# Patient Record
Sex: Male | Born: 1965 | Race: Black or African American | Hispanic: No | Marital: Married | Smoking: Former smoker
Health system: Southern US, Community
[De-identification: ages and names within clinical notes are randomized; demographics above are authoritative.]

## PROBLEM LIST (undated history)

## (undated) DIAGNOSIS — T7840XA Allergy, unspecified, initial encounter: Secondary | ICD-10-CM

## (undated) DIAGNOSIS — E7800 Pure hypercholesterolemia, unspecified: Secondary | ICD-10-CM

## (undated) DIAGNOSIS — I10 Essential (primary) hypertension: Secondary | ICD-10-CM

## (undated) DIAGNOSIS — R519 Headache, unspecified: Secondary | ICD-10-CM

## (undated) HISTORY — DX: Allergy, unspecified, initial encounter: T78.40XA

## (undated) HISTORY — DX: Headache, unspecified: R51.9

## (undated) HISTORY — PX: ROTATOR CUFF REPAIR: SHX139

## (undated) HISTORY — DX: Pure hypercholesterolemia, unspecified: E78.00

## (undated) HISTORY — PX: COLONOSCOPY: SHX174

## (undated) HISTORY — PX: COSMETIC SURGERY: SHX468

---

## 2006-01-18 ENCOUNTER — Emergency Department (HOSPITAL_COMMUNITY): Admission: EM | Admit: 2006-01-18 | Discharge: 2006-01-18 | Payer: Self-pay | Admitting: Family Medicine

## 2006-01-20 ENCOUNTER — Emergency Department (HOSPITAL_COMMUNITY): Admission: EM | Admit: 2006-01-20 | Discharge: 2006-01-20 | Payer: Self-pay | Admitting: Family Medicine

## 2006-05-03 ENCOUNTER — Emergency Department (HOSPITAL_COMMUNITY): Admission: EM | Admit: 2006-05-03 | Discharge: 2006-05-03 | Payer: Self-pay | Admitting: *Deleted

## 2006-05-17 ENCOUNTER — Ambulatory Visit (HOSPITAL_COMMUNITY): Admission: RE | Admit: 2006-05-17 | Discharge: 2006-05-17 | Payer: Self-pay | Admitting: Orthopedic Surgery

## 2007-07-10 ENCOUNTER — Encounter: Admission: RE | Admit: 2007-07-10 | Discharge: 2007-07-10 | Payer: Self-pay

## 2009-06-04 ENCOUNTER — Encounter: Admission: RE | Admit: 2009-06-04 | Discharge: 2009-06-04 | Payer: Self-pay | Admitting: Internal Medicine

## 2009-07-14 ENCOUNTER — Inpatient Hospital Stay (HOSPITAL_COMMUNITY): Admission: EM | Admit: 2009-07-14 | Discharge: 2009-07-16 | Payer: Self-pay | Admitting: Emergency Medicine

## 2009-07-22 ENCOUNTER — Emergency Department (HOSPITAL_COMMUNITY)
Admission: EM | Admit: 2009-07-22 | Discharge: 2009-07-22 | Payer: Self-pay | Source: Home / Self Care | Admitting: Emergency Medicine

## 2010-06-09 LAB — LIPID PANEL
Cholesterol: 168 mg/dL (ref 0–200)
HDL: 41 mg/dL (ref >39)
LDL Cholesterol: 101 mg/dL — ABNORMAL HIGH (ref 0–99)
Total CHOL/HDL Ratio: 4.1 RATIO
Triglycerides: 132 mg/dL (ref <150)
VLDL: 26 mg/dL (ref 0–40)

## 2010-06-09 LAB — COMPREHENSIVE METABOLIC PANEL
ALT: 14 U/L (ref 0–53)
ALT: 17 U/L (ref 0–53)
ALT: 19 U/L (ref 0–53)
AST: 13 U/L (ref 0–37)
AST: 19 U/L (ref 0–37)
AST: 24 U/L (ref 0–37)
Albumin: 3 g/dL — ABNORMAL LOW (ref 3.5–5.2)
Albumin: 3.1 g/dL — ABNORMAL LOW (ref 3.5–5.2)
Albumin: 3.6 g/dL (ref 3.5–5.2)
Alkaline Phosphatase: 41 U/L (ref 39–117)
Alkaline Phosphatase: 41 U/L (ref 39–117)
Alkaline Phosphatase: 46 U/L (ref 39–117)
BUN: 15 mg/dL (ref 6–23)
BUN: 7 mg/dL (ref 6–23)
BUN: 9 mg/dL (ref 6–23)
CO2: 26 mEq/L (ref 19–32)
CO2: 26 mEq/L (ref 19–32)
CO2: 26 mEq/L (ref 19–32)
Calcium: 8.8 mg/dL (ref 8.4–10.5)
Calcium: 8.9 mg/dL (ref 8.4–10.5)
Calcium: 9.3 mg/dL (ref 8.4–10.5)
Chloride: 102 mEq/L (ref 96–112)
Chloride: 105 mEq/L (ref 96–112)
Chloride: 105 mEq/L (ref 96–112)
Creatinine, Ser: 1.12 mg/dL (ref 0.4–1.5)
Creatinine, Ser: 1.16 mg/dL (ref 0.4–1.5)
Creatinine, Ser: 1.2 mg/dL (ref 0.4–1.5)
GFR calc Af Amer: >60 mL/min (ref >60)
GFR calc Af Amer: >60 mL/min (ref >60)
GFR calc Af Amer: >60 mL/min (ref >60)
GFR calc non Af Amer: >60 mL/min (ref >60)
GFR calc non Af Amer: >60 mL/min (ref >60)
GFR calc non Af Amer: >60 mL/min (ref >60)
Glucose, Bld: 107 mg/dL — ABNORMAL HIGH (ref 70–99)
Glucose, Bld: 110 mg/dL — ABNORMAL HIGH (ref 70–99)
Glucose, Bld: 87 mg/dL (ref 70–99)
Potassium: 3.7 mEq/L (ref 3.5–5.1)
Potassium: 3.8 mEq/L (ref 3.5–5.1)
Potassium: 4.1 mEq/L (ref 3.5–5.1)
Sodium: 133 mEq/L — ABNORMAL LOW (ref 135–145)
Sodium: 137 mEq/L (ref 135–145)
Sodium: 137 mEq/L (ref 135–145)
Total Bilirubin: 0.9 mg/dL (ref 0.3–1.2)
Total Bilirubin: 1 mg/dL (ref 0.3–1.2)
Total Bilirubin: 1.2 mg/dL (ref 0.3–1.2)
Total Protein: 6.2 g/dL (ref 6.0–8.3)
Total Protein: 6.3 g/dL (ref 6.0–8.3)
Total Protein: 6.8 g/dL (ref 6.0–8.3)

## 2010-06-09 LAB — DIFFERENTIAL
Basophils Absolute: 0 10*3/uL (ref 0.0–0.1)
Basophils Relative: 0 % (ref 0–1)
Eosinophils Absolute: 0.4 10*3/uL (ref 0.0–0.7)
Eosinophils Relative: 5 % (ref 0–5)
Lymphocytes Relative: 27 % (ref 12–46)
Lymphs Abs: 1.9 10*3/uL (ref 0.7–4.0)
Monocytes Absolute: 0.6 10*3/uL (ref 0.1–1.0)
Monocytes Relative: 9 % (ref 3–12)
Neutro Abs: 4.3 10*3/uL (ref 1.7–7.7)
Neutrophils Relative %: 60 % (ref 43–77)

## 2010-06-09 LAB — CBC
HCT: 38.8 % — ABNORMAL LOW (ref 39.0–52.0)
HCT: 41 % (ref 39.0–52.0)
Hemoglobin: 13.4 g/dL (ref 13.0–17.0)
Hemoglobin: 14.3 g/dL (ref 13.0–17.0)
MCHC: 34.5 g/dL (ref 30.0–36.0)
MCHC: 34.9 g/dL (ref 30.0–36.0)
MCV: 87.9 fL (ref 78.0–100.0)
MCV: 88 fL (ref 78.0–100.0)
Platelets: 170 10*3/uL (ref 150–400)
Platelets: 197 10*3/uL (ref 150–400)
RBC: 4.41 MIL/uL (ref 4.22–5.81)
RBC: 4.65 MIL/uL (ref 4.22–5.81)
RDW: 13.3 % (ref 11.5–15.5)
RDW: 13.4 % (ref 11.5–15.5)
WBC: 5.8 10*3/uL (ref 4.0–10.5)
WBC: 7.3 10*3/uL (ref 4.0–10.5)

## 2010-06-09 LAB — LACTATE DEHYDROGENASE: LDH: 115 U/L (ref 94–250)

## 2010-06-09 LAB — LIPASE, BLOOD
Lipase: 147 U/L — ABNORMAL HIGH (ref 11–59)
Lipase: 28 U/L (ref 11–59)
Lipase: 35 U/L (ref 11–59)

## 2011-11-17 ENCOUNTER — Ambulatory Visit (INDEPENDENT_AMBULATORY_CARE_PROVIDER_SITE_OTHER): Payer: BC Managed Care – PPO | Admitting: Family Medicine

## 2011-11-17 VITALS — BP 130/82 | HR 60 | Temp 98.1°F | Resp 16 | Ht 69.25 in | Wt 211.0 lb

## 2011-11-17 DIAGNOSIS — B9789 Other viral agents as the cause of diseases classified elsewhere: Secondary | ICD-10-CM

## 2011-11-17 DIAGNOSIS — I10 Essential (primary) hypertension: Secondary | ICD-10-CM | POA: Insufficient documentation

## 2011-11-17 DIAGNOSIS — T6391XA Toxic effect of contact with unspecified venomous animal, accidental (unintentional), initial encounter: Secondary | ICD-10-CM

## 2011-11-17 DIAGNOSIS — L739 Follicular disorder, unspecified: Secondary | ICD-10-CM

## 2011-11-17 DIAGNOSIS — T63481A Toxic effect of venom of other arthropod, accidental (unintentional), initial encounter: Secondary | ICD-10-CM

## 2011-11-17 DIAGNOSIS — R5381 Other malaise: Secondary | ICD-10-CM

## 2011-11-17 DIAGNOSIS — L678 Other hair color and hair shaft abnormalities: Secondary | ICD-10-CM

## 2011-11-17 DIAGNOSIS — L738 Other specified follicular disorders: Secondary | ICD-10-CM

## 2011-11-17 DIAGNOSIS — B349 Viral infection, unspecified: Secondary | ICD-10-CM

## 2011-11-17 LAB — POCT CBC
Granulocyte percent: 59.3 %G (ref 37–80)
HCT, POC: 46.9 % (ref 43.5–53.7)
Hemoglobin: 14.5 g/dL (ref 14.1–18.1)
Lymph, poc: 1.7 (ref 0.6–3.4)
MCH, POC: 28.7 pg (ref 27–31.2)
MCHC: 30.9 g/dL — AB (ref 31.8–35.4)
MCV: 92.8 fL (ref 80–97)
MID (cbc): 0.4 (ref 0–0.9)
MPV: 9.8 fL (ref 0–99.8)
POC Granulocyte: 3.1 (ref 2–6.9)
POC LYMPH PERCENT: 33.1 %L (ref 10–50)
POC MID %: 7.6 %M (ref 0–12)
Platelet Count, POC: 224 10*3/uL (ref 142–424)
RBC: 5.05 M/uL (ref 4.69–6.13)
RDW, POC: 14.4 %
WBC: 5.2 10*3/uL (ref 4.6–10.2)

## 2011-11-17 MED ORDER — DOXYCYCLINE HYCLATE 100 MG PO TABS: 100.0000 mg | ORAL_TABLET | Freq: Two times a day (BID) | ORAL | Status: AC
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
## 2011-11-17 NOTE — Patient Instructions (Signed)
Take the antibiotic twice daily.

## 2011-11-20 LAB — WOUND CULTURE
Gram Stain: NONE SEEN
Gram Stain: NONE SEEN
Gram Stain: NONE SEEN
Organism ID, Bacteria: NO GROWTH

## 2012-01-11 ENCOUNTER — Ambulatory Visit: Payer: Self-pay | Admitting: *Deleted

## 2012-06-09 ENCOUNTER — Other Ambulatory Visit: Payer: Self-pay | Admitting: Internal Medicine

## 2012-06-09 DIAGNOSIS — M542 Cervicalgia: Secondary | ICD-10-CM

## 2012-06-20 HISTORY — PX: SHOULDER SURGERY: SHX246

## 2014-10-08 ENCOUNTER — Ambulatory Visit: Payer: Self-pay

## 2014-10-08 ENCOUNTER — Other Ambulatory Visit: Payer: Self-pay | Admitting: Occupational Medicine

## 2014-10-08 DIAGNOSIS — M25531 Pain in right wrist: Secondary | ICD-10-CM

## 2014-12-17 ENCOUNTER — Other Ambulatory Visit: Payer: Self-pay | Admitting: Occupational Medicine

## 2014-12-17 ENCOUNTER — Ambulatory Visit: Payer: Self-pay

## 2014-12-17 DIAGNOSIS — M79671 Pain in right foot: Secondary | ICD-10-CM

## 2015-06-03 ENCOUNTER — Other Ambulatory Visit: Payer: Self-pay | Admitting: Orthopedic Surgery

## 2015-06-05 NOTE — Pre-Procedure Instructions (Signed)
    Leane Plattnthony Brackin  06/05/2015      [id number]- [place], [place] - [address] [place] [id number] Phone: [phone] Fax: [phone]    Your procedure is scheduled on 06/10/15.  [facility] Admitting at 12 noon  Call this number if you have problems the morning of surgery:  [phone]   Remember:  Do not eat food or drink liquids after midnight.  Take these medicines the morning of surgery with A SIP OF WATER --norvasc   Do not wear jewelry, make-up or nail polish.  Do not wear lotions, powders, or perfumes.  You may wear deodorant.  Do not shave 48 hours prior to surgery.  Men may shave face and neck.  Do not bring valuables to the hospital.  [facility] is not responsible for any belongings or valuables.  Contacts, dentures or bridgework may not be worn into surgery.  Leave your suitcase in the car.  After surgery it may be brought to your room.  For patients admitted to the hospital, discharge time will be determined by your treatment team.  Patients discharged the day of surgery will not be allowed to drive home.   Name and phone number of your driver:  Special instructions:    Please read over the following fact sheets that you were given. Pain Booklet, Coughing and Deep Breathing and Surgical Site Infection Prevention

## 2015-06-06 ENCOUNTER — Other Ambulatory Visit: Payer: Self-pay

## 2015-06-06 ENCOUNTER — Encounter (HOSPITAL_COMMUNITY)
Admission: RE | Admit: 2015-06-06 | Discharge: 2015-06-06 | Disposition: A | Discharge status: Home / Self Care | Payer: BLUE CROSS/BLUE SHIELD | Source: Ambulatory Visit | Attending: Orthopedic Surgery | Admitting: Orthopedic Surgery

## 2015-06-06 ENCOUNTER — Encounter (HOSPITAL_COMMUNITY): Payer: Self-pay

## 2015-06-06 DIAGNOSIS — Z01818 Encounter for other preprocedural examination: Secondary | ICD-10-CM | POA: Insufficient documentation

## 2015-06-06 DIAGNOSIS — I10 Essential (primary) hypertension: Secondary | ICD-10-CM | POA: Insufficient documentation

## 2015-06-06 DIAGNOSIS — M7541 Impingement syndrome of right shoulder: Secondary | ICD-10-CM | POA: Diagnosis not present

## 2015-06-06 DIAGNOSIS — Z01812 Encounter for preprocedural laboratory examination: Secondary | ICD-10-CM | POA: Insufficient documentation

## 2015-06-06 DIAGNOSIS — Z87891 Personal history of nicotine dependence: Secondary | ICD-10-CM | POA: Diagnosis not present

## 2015-06-06 HISTORY — DX: Essential (primary) hypertension: I10

## 2015-06-06 LAB — BASIC METABOLIC PANEL
Anion gap: 8 (ref 5–15)
BUN: 14 mg/dL (ref 6–20)
CO2: 26 mmol/L (ref 22–32)
Calcium: 9.5 mg/dL (ref 8.9–10.3)
Chloride: 108 mmol/L (ref 101–111)
Creatinine, Ser: 1.22 mg/dL (ref 0.61–1.24)
GFR calc Af Amer: >60 mL/min (ref >60)
GFR calc non Af Amer: >60 mL/min (ref >60)
Glucose, Bld: 111 mg/dL — ABNORMAL HIGH (ref 65–99)
Potassium: 4.1 mmol/L (ref 3.5–5.1)
Sodium: 142 mmol/L (ref 135–145)

## 2015-06-06 LAB — CBC
HCT: 40.8 % (ref 39.0–52.0)
Hemoglobin: 13.7 g/dL (ref 13.0–17.0)
MCH: 29.1 pg (ref 26.0–34.0)
MCHC: 33.6 g/dL (ref 30.0–36.0)
MCV: 86.8 fL (ref 78.0–100.0)
Platelets: 191 10*3/uL (ref 150–400)
RBC: 4.7 MIL/uL (ref 4.22–5.81)
RDW: 13.4 % (ref 11.5–15.5)
WBC: 7.8 10*3/uL (ref 4.0–10.5)

## 2015-06-09 MED ORDER — CEFAZOLIN SODIUM-DEXTROSE 2-3 GM-% IV SOLR
2.0000 g | INTRAVENOUS | Status: AC
  Administered 2015-06-10: 2 g via INTRAVENOUS
  Filled 2015-06-09: qty 50
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
## 2015-06-10 ENCOUNTER — Encounter (HOSPITAL_COMMUNITY)
Admission: RE | Disposition: A | Discharge status: Home / Self Care | Payer: Self-pay | Source: Ambulatory Visit | Attending: Orthopedic Surgery

## 2015-06-10 ENCOUNTER — Ambulatory Visit (HOSPITAL_COMMUNITY): Payer: BLUE CROSS/BLUE SHIELD | Admitting: Anesthesiology

## 2015-06-10 ENCOUNTER — Ambulatory Visit (HOSPITAL_COMMUNITY): Payer: BLUE CROSS/BLUE SHIELD | Admitting: Vascular Surgery

## 2015-06-10 ENCOUNTER — Encounter (HOSPITAL_COMMUNITY): Payer: Self-pay | Admitting: Certified Registered Nurse Anesthetist

## 2015-06-10 ENCOUNTER — Ambulatory Visit (HOSPITAL_COMMUNITY)
Admission: RE | Admit: 2015-06-10 | Discharge: 2015-06-10 | Disposition: A | Discharge status: Home / Self Care | Payer: BLUE CROSS/BLUE SHIELD | Source: Ambulatory Visit | Attending: Orthopedic Surgery | Admitting: Orthopedic Surgery

## 2015-06-10 DIAGNOSIS — Z87891 Personal history of nicotine dependence: Secondary | ICD-10-CM | POA: Insufficient documentation

## 2015-06-10 DIAGNOSIS — I10 Essential (primary) hypertension: Secondary | ICD-10-CM | POA: Diagnosis not present

## 2015-06-10 DIAGNOSIS — M7551 Bursitis of right shoulder: Secondary | ICD-10-CM | POA: Insufficient documentation

## 2015-06-10 DIAGNOSIS — X58XXXA Exposure to other specified factors, initial encounter: Secondary | ICD-10-CM | POA: Insufficient documentation

## 2015-06-10 DIAGNOSIS — S43431A Superior glenoid labrum lesion of right shoulder, initial encounter: Secondary | ICD-10-CM | POA: Diagnosis not present

## 2015-06-10 HISTORY — PX: SHOULDER ARTHROSCOPY WITH SUBACROMIAL DECOMPRESSION AND BICEP TENDON REPAIR: SHX5689

## 2015-06-10 SURGERY — SHOULDER ARTHROSCOPY WITH SUBACROMIAL DECOMPRESSION AND BICEP TENDON REPAIR
Anesthesia: General | Site: Shoulder | Laterality: Right

## 2015-06-10 MED ORDER — DIPHENHYDRAMINE HCL 50 MG/ML IJ SOLN
INTRAMUSCULAR | Status: AC
  Filled 2015-06-10: qty 1

## 2015-06-10 MED ORDER — CHLORHEXIDINE GLUCONATE 4 % EX LIQD: 60.0000 mL | Freq: Once | CUTANEOUS | Status: DC

## 2015-06-10 MED ORDER — PROPOFOL 10 MG/ML IV BOLUS
INTRAVENOUS | Status: AC
  Filled 2015-06-10: qty 20

## 2015-06-10 MED ORDER — ARTIFICIAL TEARS OP OINT
TOPICAL_OINTMENT | OPHTHALMIC | Status: DC | PRN
  Administered 2015-06-10: 1 via OPHTHALMIC

## 2015-06-10 MED ORDER — PROPOFOL 10 MG/ML IV BOLUS
INTRAVENOUS | Status: DC | PRN
  Administered 2015-06-10: 200 mg via INTRAVENOUS

## 2015-06-10 MED ORDER — FENTANYL CITRATE (PF) 100 MCG/2ML IJ SOLN
INTRAMUSCULAR | Status: AC
  Administered 2015-06-10: 100 ug
  Filled 2015-06-10: qty 2

## 2015-06-10 MED ORDER — FENTANYL CITRATE (PF) 250 MCG/5ML IJ SOLN
INTRAMUSCULAR | Status: AC
  Filled 2015-06-10: qty 5

## 2015-06-10 MED ORDER — 0.9 % SODIUM CHLORIDE (POUR BTL) OPTIME
TOPICAL | Status: DC | PRN
  Administered 2015-06-10 (×2): 1000 mL

## 2015-06-10 MED ORDER — EPINEPHRINE HCL 1 MG/ML IJ SOLN
INTRAMUSCULAR | Status: AC
  Filled 2015-06-10: qty 1

## 2015-06-10 MED ORDER — MIDAZOLAM HCL 2 MG/2ML IJ SOLN
INTRAMUSCULAR | Status: AC
  Administered 2015-06-10: 2 mg
  Filled 2015-06-10: qty 2

## 2015-06-10 MED ORDER — NEOSTIGMINE METHYLSULFATE 10 MG/10ML IV SOLN
INTRAVENOUS | Status: DC | PRN
  Administered 2015-06-10: 4 mg via INTRAVENOUS

## 2015-06-10 MED ORDER — SODIUM CHLORIDE 0.9 % IJ SOLN
INTRAMUSCULAR | Status: DC | PRN
  Administered 2015-06-10 (×2): 10 mL
  Administered 2015-06-10 (×2): 10 mL via INTRAVENOUS
  Administered 2015-06-10: 10 mL

## 2015-06-10 MED ORDER — MORPHINE SULFATE (PF) 4 MG/ML IV SOLN
INTRAVENOUS | Status: AC
  Filled 2015-06-10: qty 1

## 2015-06-10 MED ORDER — FENTANYL CITRATE (PF) 100 MCG/2ML IJ SOLN
INTRAMUSCULAR | Status: DC | PRN
  Administered 2015-06-10 (×2): 50 ug via INTRAVENOUS

## 2015-06-10 MED ORDER — EPINEPHRINE HCL 1 MG/ML IJ SOLN
INTRAMUSCULAR | Status: DC | PRN
  Administered 2015-06-10: .1 mL

## 2015-06-10 MED ORDER — SODIUM CHLORIDE 0.9 % IR SOLN
Status: DC | PRN
  Administered 2015-06-10 (×2): 3000 mL

## 2015-06-10 MED ORDER — ONDANSETRON HCL 4 MG/2ML IJ SOLN
INTRAMUSCULAR | Status: DC | PRN
  Administered 2015-06-10: 4 mg via INTRAVENOUS

## 2015-06-10 MED ORDER — SUCCINYLCHOLINE CHLORIDE 20 MG/ML IJ SOLN
INTRAMUSCULAR | Status: DC | PRN
  Administered 2015-06-10: 110 mg via INTRAVENOUS

## 2015-06-10 MED ORDER — LIDOCAINE HCL (CARDIAC) 20 MG/ML IV SOLN
INTRAVENOUS | Status: DC | PRN
  Administered 2015-06-10: 80 mg via INTRAVENOUS

## 2015-06-10 MED ORDER — LACTATED RINGERS IV SOLN
INTRAVENOUS | Status: DC
  Administered 2015-06-10 (×2): via INTRAVENOUS

## 2015-06-10 MED ORDER — OXYCODONE-ACETAMINOPHEN 5-325 MG PO TABS
1.0000 | ORAL_TABLET | Freq: Four times a day (QID) | ORAL | Status: DC | PRN
Start: 2015-06-10 — End: 2019-03-20

## 2015-06-10 MED ORDER — GLYCOPYRROLATE 0.2 MG/ML IJ SOLN
INTRAMUSCULAR | Status: AC
  Filled 2015-06-10: qty 3

## 2015-06-10 MED ORDER — METHOCARBAMOL 500 MG PO TABS: 500.0000 mg | ORAL_TABLET | Freq: Four times a day (QID) | ORAL | Status: DC

## 2015-06-10 MED ORDER — DIPHENHYDRAMINE HCL 50 MG/ML IJ SOLN
12.5000 mg | INTRAMUSCULAR | Status: DC
  Administered 2015-06-10: 25 mg via INTRAVENOUS

## 2015-06-10 MED ORDER — BUPIVACAINE-EPINEPHRINE (PF) 0.5% -1:200000 IJ SOLN
INTRAMUSCULAR | Status: DC | PRN
  Administered 2015-06-10: 30 mL via PERINEURAL

## 2015-06-10 MED ORDER — PHENYLEPHRINE HCL 10 MG/ML IJ SOLN
10.0000 mg | INTRAVENOUS | Status: DC | PRN
  Administered 2015-06-10: 20 ug/min via INTRAVENOUS

## 2015-06-10 MED ORDER — ROCURONIUM BROMIDE 100 MG/10ML IV SOLN
INTRAVENOUS | Status: DC | PRN
  Administered 2015-06-10: 50 mg via INTRAVENOUS

## 2015-06-10 MED ORDER — GLYCOPYRROLATE 0.2 MG/ML IJ SOLN
INTRAMUSCULAR | Status: DC | PRN
  Administered 2015-06-10: 0.6 mg via INTRAVENOUS

## 2015-06-10 SURGICAL SUPPLY — 72 items
BENZOIN TINCTURE PRP APPL 2/3 (GAUZE/BANDAGES/DRESSINGS) ×2 IMPLANT
BIT DRILL TAK (DRILL) IMPLANT
BLADE CUDA 5.5 (BLADE) IMPLANT
BLADE CUTTER GATOR 3.5 (BLADE) IMPLANT
BLADE GREAT WHITE 4.2 (BLADE) ×2 IMPLANT
BLADE SURG 11 STRL SS (BLADE) ×2 IMPLANT
BUR GATOR 2.9 (BURR) IMPLANT
BUR OVAL 6.0 (BURR) ×2 IMPLANT
CANNULA SHOULDER 7CM (CANNULA) IMPLANT
CARTRIDGE CURVETEK MED (MISCELLANEOUS) IMPLANT
CARTRIDGE CURVETEK XLRG (MISCELLANEOUS) IMPLANT
COVER SURGICAL LIGHT HANDLE (MISCELLANEOUS) ×2 IMPLANT
DRAPE INCISE IOBAN 66X45 STRL (DRAPES) ×4 IMPLANT
DRAPE STERI 35X30 U-POUCH (DRAPES) ×2 IMPLANT
DRAPE U-SHAPE 47X51 STRL (DRAPES) ×4 IMPLANT
DRILL TAK (DRILL)
DRSG PAD ABDOMINAL 8X10 ST (GAUZE/BANDAGES/DRESSINGS) ×6 IMPLANT
DRSG TEGADERM 4X4.75 (GAUZE/BANDAGES/DRESSINGS) ×2 IMPLANT
DURAPREP 26ML APPLICATOR (WOUND CARE) ×2 IMPLANT
ELECT MENISCUS 165MM 90D (ELECTRODE) IMPLANT
ELECT REM PT RETURN 9FT ADLT (ELECTROSURGICAL) ×2
ELECTRODE REM PT RTRN 9FT ADLT (ELECTROSURGICAL) ×1 IMPLANT
FILTER STRAW FLUID ASPIR (MISCELLANEOUS) ×2 IMPLANT
GAUZE SPONGE 4X4 12PLY STRL (GAUZE/BANDAGES/DRESSINGS) ×2 IMPLANT
GAUZE XEROFORM 1X8 LF (GAUZE/BANDAGES/DRESSINGS) ×2 IMPLANT
GLOVE BIO SURGEON ST LM GN SZ9 (GLOVE) ×2 IMPLANT
GLOVE BIOGEL PI IND STRL 8 (GLOVE) ×1 IMPLANT
GLOVE BIOGEL PI INDICATOR 8 (GLOVE) ×1
GLOVE SURG ORTHO 8.0 STRL STRW (GLOVE) ×2 IMPLANT
GOWN STRL REUS W/ TWL LRG LVL3 (GOWN DISPOSABLE) ×3 IMPLANT
GOWN STRL REUS W/TWL LRG LVL3 (GOWN DISPOSABLE) ×3
KIT BASIN OR (CUSTOM PROCEDURE TRAY) ×2 IMPLANT
KIT BIO-TENODESIS 3X8 DISP (MISCELLANEOUS) ×1
KIT INSRT BABSR STRL DISP BTN (MISCELLANEOUS) ×1 IMPLANT
KIT ROOM TURNOVER OR (KITS) ×2 IMPLANT
MANIFOLD NEPTUNE II (INSTRUMENTS) ×2 IMPLANT
NDL SUT 6 .5 CRC .975X.05 MAYO (NEEDLE) ×1 IMPLANT
NEEDLE HYPO 25X1 1.5 SAFETY (NEEDLE) ×2 IMPLANT
NEEDLE MAYO TAPER (NEEDLE) ×1
NEEDLE SPNL 18GX3.5 QUINCKE PK (NEEDLE) ×2 IMPLANT
NS IRRIG 1000ML POUR BTL (IV SOLUTION) ×2 IMPLANT
PACK SHOULDER (CUSTOM PROCEDURE TRAY) ×2 IMPLANT
PAD ARMBOARD 7.5X6 YLW CONV (MISCELLANEOUS) ×4 IMPLANT
SCREW TENODESIS BIOCOMP 7MM (Screw) ×2 IMPLANT
SET ARTHROSCOPY TUBING (MISCELLANEOUS) ×1
SET ARTHROSCOPY TUBING LN (MISCELLANEOUS) ×1 IMPLANT
SLING ARM IMMOBILIZER MED (SOFTGOODS) IMPLANT
SPEAR FASTAKII (SLEEVE) IMPLANT
SPONGE GAUZE 4X4 12PLY STER LF (GAUZE/BANDAGES/DRESSINGS) ×2 IMPLANT
SPONGE LAP 4X18 X RAY DECT (DISPOSABLE) ×4 IMPLANT
STRIP CLOSURE SKIN 1/2X4 (GAUZE/BANDAGES/DRESSINGS) ×2 IMPLANT
SUCTION FRAZIER HANDLE 10FR (MISCELLANEOUS) ×1
SUCTION TUBE FRAZIER 10FR DISP (MISCELLANEOUS) ×1 IMPLANT
SUT ETHILON 3 0 PS 1 (SUTURE) ×6 IMPLANT
SUT FIBERWIRE 2-0 18 17.9 3/8 (SUTURE)
SUT MNCRL AB 3-0 PS2 18 (SUTURE) ×2 IMPLANT
SUT PROLENE 3 0 PS 2 (SUTURE) ×2 IMPLANT
SUT VIC AB 0 CT1 27 (SUTURE) ×2
SUT VIC AB 0 CT1 27XBRD ANBCTR (SUTURE) ×2 IMPLANT
SUT VIC AB 1 CT1 27 (SUTURE) ×1
SUT VIC AB 1 CT1 27XBRD ANBCTR (SUTURE) ×1 IMPLANT
SUT VIC AB 2-0 CT1 27 (SUTURE) ×1
SUT VIC AB 2-0 CT1 TAPERPNT 27 (SUTURE) ×1 IMPLANT
SUT VICRYL 0 UR6 27IN ABS (SUTURE) IMPLANT
SUTURE FIBERWR 2-0 18 17.9 3/8 (SUTURE) IMPLANT
SYR 20CC LL (SYRINGE) ×4 IMPLANT
SYR 3ML LL SCALE MARK (SYRINGE) ×2 IMPLANT
SYR TB 1ML LUER SLIP (SYRINGE) ×2 IMPLANT
TOWEL OR 17X24 6PK STRL BLUE (TOWEL DISPOSABLE) ×2 IMPLANT
TOWEL OR 17X26 10 PK STRL BLUE (TOWEL DISPOSABLE) ×2 IMPLANT
WAND HAND CNTRL MULTIVAC 90 (MISCELLANEOUS) ×2 IMPLANT
WATER STERILE IRR 1000ML POUR (IV SOLUTION) ×2 IMPLANT

## 2015-06-10 NOTE — Anesthesia Preprocedure Evaluation (Addendum)
Anesthesia Evaluation  Patient identified by MRN, date of birth, ID band Patient awake    Reviewed: Allergy & Precautions, NPO status , Patient's Chart, lab work & pertinent test results  Airway Mallampati: II  TM Distance: >3 FB Neck ROM: Full    Dental  (+) Teeth Intact, Dental Advidsory Given   Pulmonary former smoker,    breath sounds clear to auscultation       Cardiovascular hypertension, On Medications  Rhythm:Regular Rate:Normal     Neuro/Psych    GI/Hepatic   Endo/Other  Morbid obesity  Renal/GU      Musculoskeletal   Abdominal (+) + obese,   Peds  Hematology   Anesthesia Other Findings   Reproductive/Obstetrics                            Anesthesia Physical Anesthesia Plan  ASA: II  Anesthesia Plan: General   Post-op Pain Management: GA combined w/ Regional for post-op pain   Induction: Intravenous  Airway Management Planned: Oral ETT  Additional Equipment:   Intra-op Plan:   Post-operative Plan: Extubation in OR  Informed Consent: I have reviewed the patients History and Physical, chart, labs and discussed the procedure including the risks, benefits and alternatives for the proposed anesthesia with the patient or authorized representative who has indicated his/her understanding and acceptance.   Dental Advisory Given  Plan Discussed with: Anesthesiologist  Anesthesia Plan Comments:        Anesthesia Quick Evaluation

## 2015-06-10 NOTE — Anesthesia Procedure Notes (Addendum)
Anesthesia Regional Block:  Interscalene brachial plexus block  Pre-Anesthetic Checklist: ,, timeout performed, Correct Patient, Correct Site, Correct Laterality, Correct Procedure, Correct Position, site marked, Risks and benefits discussed,  Surgical consent,  Pre-op evaluation,  At surgeon's request and post-op pain management  Laterality: Upper and Right  Prep: chloraprep       Needles:  Injection technique: Single-shot  Needle Type: Echogenic Stimulator Needle     Needle Length: 9cm 9 cm Needle Gauge: 21 and 21 G  Needle insertion depth: 7 cm   Additional Needles:  Procedures: nerve stimulator Interscalene brachial plexus block  Nerve Stimulator or Paresthesia:  Response: Twitch elicited, 0.5 mA, 0.3 ms,   Additional Responses:   Narrative:  Start time: 06/10/2015 2:17 PM End time: 06/10/2015 2:27 PM Injection made incrementally with aspirations every 5 mL.  Performed by: Personally  Anesthesiologist: Jaquelin Meaney  Additional Notes: Block assessed prior to start of surgery   Procedure Name: Intubation Date/Time: 06/10/2015 2:58 PM Performed by: Carmela RimaMARTINELLI, JOHN F Pre-anesthesia Checklist: Suction available, Patient being monitored, Emergency Drugs available and Patient identified Patient Re-evaluated:Patient Re-evaluated prior to inductionOxygen Delivery Method: Circle system utilized Preoxygenation: Pre-oxygenation with 100% oxygen Intubation Type: IV induction Ventilation: Mask ventilation without difficulty and Oral airway inserted - appropriate to patient size Laryngoscope Size: Glidescope and 3 Grade View: Grade I Tube type: Oral Tube size: 7.5 mm Number of attempts: 1 Airway Equipment and Method: Stylet,  Video-laryngoscopy and Oral airway Placement Confirmation: breath sounds checked- equal and bilateral,  ETT inserted through vocal cords under direct vision and positive ETCO2 Secured at: 23 cm Tube secured with: Tape Dental Injury: Teeth and  Oropharynx as per pre-operative assessment

## 2015-06-10 NOTE — Progress Notes (Signed)
Pt showered at home X1 with CHG without any problems. CHG wipes given to patient who began itching over his body after this.  Was given a wet cloth to wipe off with but continues to itch.  Small bumps present on arms and chest.  Dr Jacklynn BueMassagee notified and order received and given
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
## 2015-06-10 NOTE — Brief Op Note (Signed)
06/10/2015  4:59 PM  PATIENT:  Leane Plattnthony Pike  50 y.o. male  PRE-OPERATIVE DIAGNOSIS:  right shoulder labral tear, impingement  POST-OPERATIVE DIAGNOSIS:  right shoulder labral tear, impingement  PROCEDURE:  Procedure(s): RIGHT SHOULDER ARTHROSCOPY WITH SUBACROMIAL DECOMPRESSION, LABRAL DEBRIDEMENT AND OPEN BICEPS TENODESIS  SURGEON:  Surgeon(s): Cammy CopaScott Gregory Dean, MD  ASSISTANT: Patrick Jupiterarla Bethune RNFA  ANESTHESIA:   general  EBL: 25 ml    Total I/O In: 1300 [I.V.:1300] Out: 50 [Blood:50]  BLOOD ADMINISTERED: none  DRAINS: none   LOCAL MEDICATIONS USED:  none  SPECIMEN:  No Specimen  COUNTS:  YES  TOURNIQUET:  * No tourniquets in log *  DICTATION: .Other Dictation: Dictation Number [id number]  PLAN OF CARE: Discharge to home after PACU  PATIENT DISPOSITION:  PACU - hemodynamically stable

## 2015-06-10 NOTE — Transfer of Care (Signed)
Immediate Anesthesia Transfer of Care Note  Patient: Glenn Carrillo  Procedure(s) Performed: Procedure(s): RIGHT SHOULDER ARTHROSCOPY WITH SUBACROMIAL DECOMPRESSION, LABRAL DEBRIDEMENT AND OPEN BICEPS TENODESIS (Right)  Patient Location: PACU  Anesthesia Type:General  Level of Consciousness: awake and alert   Airway & Oxygen Therapy: Patient Spontanous Breathing and Patient connected to nasal cannula oxygen  Post-op Assessment: Report given to RN, Post -op Vital signs reviewed and stable and Patient moving all extremities X 4  Post vital signs: Reviewed and stable  Last Vitals:  Filed Vitals:   06/10/15 1431 06/10/15 1437  BP: 127/71 130/74  Pulse:    Temp:    Resp: 13 24    Complications: No apparent anesthesia complications

## 2015-06-11 ENCOUNTER — Encounter (HOSPITAL_COMMUNITY): Payer: Self-pay | Admitting: Orthopedic Surgery

## 2015-06-11 NOTE — Op Note (Signed)
NAME:  Glenn Carrillo, Glenn Carrillo NO.:  [id number]  MEDICAL RECORD NO.:  [id number]  LOCATION:  [facility]                         FACILITY:  [facility]  PHYSICIAN:  Burnard Bunting, M.D.    DATE OF BIRTH:  [birth year]  DATE OF PROCEDURE:  06/10/2015 DATE OF DISCHARGE:  06/10/2015                              OPERATIVE REPORT   PREOPERATIVE DIAGNOSIS:  Right shoulder SLAP tear and bursitis.  POSTOPERATIVE DIAGNOSIS:  Right shoulder SLAP tear and bursitis.  PROCEDURE:  Right shoulder arthroscopy, biceps tendon release with superior labral debridement, subacromial decompression with coplaning of the clavicle, open biceps tenodesis using Arthrex 7 x 23 mm interference screw.  SURGEON:  Burnard Bunting, M.D.  ASSISTANT:  Patrick Jupiter, RNFA.  INDICATIONS:  [name] is a 50 year old patient with right shoulder pain presents for operative management after explanation of risks and benefits.  The patient has unstable SLAP tear and symptoms consistent with a SLAP tear and bursitis who presents for operative management after explanation of risks and benefits.  PROCEDURE IN DETAIL:  The patient was brought to the operating room, where general endotracheal anesthesia was induced.  Preoperative antibiotics were administered.  Time-out was called.  The patient was placed in an [name] with the head in neutral position.  The arm was examined under anesthesia, found to have full active forward flexion and abduction, external rotation to 15 degrees, abduction to about 70 degrees.  Good stability, anterior and posterior less than a cm sulcus sign.  Following examination under anesthesia, right arm was prescrubbed with alcohol and Betadine which was allowed to air dry, prepped with DuraPrep solution, draped in sterile manner.  [name] was used to cover the axilla.  Solution of saline with epinephrine injected into subacromial space.  Posterior portal created 2 cm medial and inferior to the  posterolateral margin of the acromion.  Diagnostic arthroscopy was performed.  The patient had an unstable biceps anchor and a SLAP tear.  The right rotator cuff was intact.  Glenohumeral articular surfaces were intact. At this time, the anterior portal created under direct visualization. ArthroCare wand used to release the biceps tendon.  Debridement was performed of the superior labrum.  Following this, the scope was placed in the subacromial space.  The patient has significant bursitis. Lateral portal was created under direct visualization.  Bursectomy was performed.  Subacromial decompression was then performed with coplaning of the clavicle because of the inferior clavicular spurring.  Following this, the instruments were removed.  Portals were closed using 3-0 nylon.  A 4 cm incision was made of the anterolateral margin of the acromion.  Skin and subcutaneous tissue were sharply divided.  Deltoid was split, and measured 4 cm from the internal margin of the acromion. The transverse carpal ligament was incised on the medial aspect.  The biceps tendon was then tenodesed into the bicipital groove using an Arthrex 7 x 23 mm biceps tenodesis screw under appropriate tension.  Thorough irrigation was then performed.  Deltoid split was closed with 0 Vicryl suture, followed by interrupted inverted 2-0 Vicryl suture and 3-0 Monocryl.  Impervious dressing applied.  The patient tolerated the procedure well without immediate complication.  Burnard BuntingG. Scott Aerin Delany, M.D.     GSD/MEDQ  D:  06/10/2015  T:  06/10/2015  Job:  [id number]

## 2015-06-11 NOTE — Anesthesia Postprocedure Evaluation (Signed)
Anesthesia Post Note  Patient: Glenn Carrillo  Procedure(s) Performed: Procedure(s) (LRB): RIGHT SHOULDER ARTHROSCOPY WITH SUBACROMIAL DECOMPRESSION, LABRAL DEBRIDEMENT AND OPEN BICEPS TENODESIS (Right)  Patient location during evaluation: PACU Anesthesia Type: General and Regional Level of consciousness: awake and alert, oriented and patient cooperative Pain management: pain level controlled Vital Signs Assessment: post-procedure vital signs reviewed and stable Respiratory status: spontaneous breathing, nonlabored ventilation and respiratory function stable Cardiovascular status: blood pressure returned to baseline and stable Postop Assessment: no signs of nausea or vomiting Anesthetic complications: no Comments: Late entry: pt eval in PACU post op    Last Vitals:  Filed Vitals:   06/10/15 1815 06/10/15 1830  BP: 125/84 122/83  Pulse: 71 71  Temp:    Resp: 17 16    Last Pain:  Filed Vitals:   06/10/15 1835  PainSc: Asleep                 Tarisa Paola,E. Jamas Jaquay

## 2015-06-23 NOTE — Addendum Note (Signed)
Addendum  created 06/23/15 1719 by Sharee Holstererry Demoni Gergen, MD   Modules edited: Anesthesia Blocks and Procedures, Clinical Notes   Clinical Notes:  File: [id number]

## 2015-11-08 ENCOUNTER — Encounter (INDEPENDENT_AMBULATORY_CARE_PROVIDER_SITE_OTHER): Payer: Self-pay

## 2015-11-08 DIAGNOSIS — J40 Bronchitis, not specified as acute or chronic: Secondary | ICD-10-CM

## 2015-11-08 DIAGNOSIS — K219 Gastro-esophageal reflux disease without esophagitis: Secondary | ICD-10-CM | POA: Insufficient documentation

## 2016-01-30 ENCOUNTER — Encounter (INDEPENDENT_AMBULATORY_CARE_PROVIDER_SITE_OTHER): Payer: Self-pay | Admitting: Orthopedic Surgery

## 2016-01-30 ENCOUNTER — Ambulatory Visit (INDEPENDENT_AMBULATORY_CARE_PROVIDER_SITE_OTHER): Payer: BLUE CROSS/BLUE SHIELD | Admitting: Orthopedic Surgery

## 2016-01-30 DIAGNOSIS — S43431D Superior glenoid labrum lesion of right shoulder, subsequent encounter: Secondary | ICD-10-CM

## 2016-01-30 MED ORDER — METHOCARBAMOL 500 MG PO TABS: 500.0000 mg | ORAL_TABLET | Freq: Three times a day (TID) | ORAL | 0 refills | Status: DC | PRN
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
## 2016-02-12 ENCOUNTER — Other Ambulatory Visit (INDEPENDENT_AMBULATORY_CARE_PROVIDER_SITE_OTHER): Payer: Self-pay | Admitting: Orthopedic Surgery

## 2016-02-16 NOTE — Telephone Encounter (Signed)
Rx refill

## 2016-03-05 ENCOUNTER — Encounter (INDEPENDENT_AMBULATORY_CARE_PROVIDER_SITE_OTHER): Payer: Self-pay | Admitting: Orthopedic Surgery

## 2016-03-05 ENCOUNTER — Ambulatory Visit (INDEPENDENT_AMBULATORY_CARE_PROVIDER_SITE_OTHER): Payer: BLUE CROSS/BLUE SHIELD | Admitting: Orthopedic Surgery

## 2016-03-05 DIAGNOSIS — M25511 Pain in right shoulder: Secondary | ICD-10-CM | POA: Diagnosis not present

## 2016-03-05 DIAGNOSIS — G8929 Other chronic pain: Secondary | ICD-10-CM

## 2016-03-05 DIAGNOSIS — M19019 Primary osteoarthritis, unspecified shoulder: Secondary | ICD-10-CM | POA: Diagnosis not present

## 2016-03-05 MED ORDER — DICLOFENAC SODIUM 2 % TD SOLN: 2.0000 | Freq: Two times a day (BID) | TRANSDERMAL | 0 refills | Status: DC
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
## 2016-03-05 NOTE — Addendum Note (Signed)
Addended byCherre Huger: Callen Zuba on: 03/05/2016 10:32 AM   Modules accepted: Orders

## 2018-05-09 ENCOUNTER — Emergency Department (HOSPITAL_COMMUNITY)
Admission: EM | Admit: 2018-05-09 | Discharge: 2018-05-09 | Disposition: A | Discharge status: Home / Self Care | Payer: No Typology Code available for payment source | Attending: Emergency Medicine | Admitting: Emergency Medicine

## 2018-05-09 ENCOUNTER — Emergency Department (HOSPITAL_COMMUNITY): Payer: No Typology Code available for payment source

## 2018-05-09 ENCOUNTER — Other Ambulatory Visit: Payer: Self-pay

## 2018-05-09 DIAGNOSIS — M25512 Pain in left shoulder: Secondary | ICD-10-CM | POA: Diagnosis not present

## 2018-05-09 DIAGNOSIS — Z87891 Personal history of nicotine dependence: Secondary | ICD-10-CM | POA: Diagnosis not present

## 2018-05-09 DIAGNOSIS — I10 Essential (primary) hypertension: Secondary | ICD-10-CM | POA: Diagnosis not present

## 2018-05-09 DIAGNOSIS — M5441 Lumbago with sciatica, right side: Secondary | ICD-10-CM

## 2018-05-09 DIAGNOSIS — Z79899 Other long term (current) drug therapy: Secondary | ICD-10-CM | POA: Insufficient documentation

## 2018-05-09 MED ORDER — KETOROLAC TROMETHAMINE 30 MG/ML IJ SOLN
30.0000 mg | Freq: Once | INTRAMUSCULAR | Status: AC
  Administered 2018-05-09: 30 mg via INTRAMUSCULAR
  Filled 2018-05-09: qty 1

## 2018-05-09 MED ORDER — METHOCARBAMOL 500 MG PO TABS: 500.0000 mg | ORAL_TABLET | Freq: Two times a day (BID) | ORAL | 0 refills | Status: DC

## 2018-05-09 MED ORDER — NAPROXEN 500 MG PO TABS: 500.0000 mg | ORAL_TABLET | Freq: Two times a day (BID) | ORAL | 0 refills | Status: DC
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
## 2018-05-09 NOTE — ED Notes (Signed)
Discharge instructions and prescriptions discussed with Pt. Pt verbalized understanding. Pt stable and ambulatory.   
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
## 2019-03-20 ENCOUNTER — Ambulatory Visit (HOSPITAL_COMMUNITY)
Admission: EM | Admit: 2019-03-20 | Discharge: 2019-03-20 | Disposition: A | Discharge status: Home / Self Care | Payer: Commercial Managed Care - PPO | Attending: Family Medicine | Admitting: Family Medicine

## 2019-03-20 ENCOUNTER — Encounter (HOSPITAL_COMMUNITY): Payer: Self-pay

## 2019-03-20 ENCOUNTER — Other Ambulatory Visit: Payer: Self-pay

## 2019-03-20 DIAGNOSIS — Z792 Long term (current) use of antibiotics: Secondary | ICD-10-CM | POA: Insufficient documentation

## 2019-03-20 DIAGNOSIS — Z888 Allergy status to other drugs, medicaments and biological substances status: Secondary | ICD-10-CM | POA: Insufficient documentation

## 2019-03-20 DIAGNOSIS — Z87891 Personal history of nicotine dependence: Secondary | ICD-10-CM | POA: Insufficient documentation

## 2019-03-20 DIAGNOSIS — Z79899 Other long term (current) drug therapy: Secondary | ICD-10-CM | POA: Insufficient documentation

## 2019-03-20 DIAGNOSIS — G8929 Other chronic pain: Secondary | ICD-10-CM | POA: Diagnosis not present

## 2019-03-20 DIAGNOSIS — Z791 Long term (current) use of non-steroidal anti-inflammatories (NSAID): Secondary | ICD-10-CM | POA: Insufficient documentation

## 2019-03-20 DIAGNOSIS — J0191 Acute recurrent sinusitis, unspecified: Secondary | ICD-10-CM

## 2019-03-20 DIAGNOSIS — Z8249 Family history of ischemic heart disease and other diseases of the circulatory system: Secondary | ICD-10-CM | POA: Diagnosis not present

## 2019-03-20 DIAGNOSIS — I10 Essential (primary) hypertension: Secondary | ICD-10-CM | POA: Insufficient documentation

## 2019-03-20 DIAGNOSIS — U071 COVID-19: Secondary | ICD-10-CM | POA: Insufficient documentation

## 2019-03-20 MED ORDER — HYDROCODONE-HOMATROPINE 5-1.5 MG/5ML PO SYRP: 5.0000 mL | ORAL_SOLUTION | Freq: Four times a day (QID) | ORAL | 0 refills | Status: DC | PRN

## 2019-03-20 MED ORDER — AZITHROMYCIN 250 MG PO TABS: 250.0000 mg | ORAL_TABLET | Freq: Every day | ORAL | 0 refills | Status: DC
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
## 2019-03-20 NOTE — ED Triage Notes (Signed)
Pt states he has sinus pressure and drainage and he has been coughing as well. X 4 days.

## 2019-03-20 NOTE — Discharge Instructions (Addendum)
Continue taking your allergy medication daily. Take the other medication sent in as prescribed. We will call you if your Covid result is positive Make sure you are taking precautions until then.

## 2019-03-22 ENCOUNTER — Telehealth (HOSPITAL_COMMUNITY): Payer: Self-pay | Admitting: Emergency Medicine

## 2019-03-22 LAB — NOVEL CORONAVIRUS, NAA (HOSP ORDER, SEND-OUT TO REF LAB; TAT 18-24 HRS): SARS-CoV-2, NAA: DETECTED — AB

## 2019-03-22 NOTE — Telephone Encounter (Signed)

## 2019-03-30 ENCOUNTER — Ambulatory Visit: Payer: Commercial Managed Care - PPO | Attending: Internal Medicine

## 2019-03-30 DIAGNOSIS — Z20822 Contact with and (suspected) exposure to covid-19: Secondary | ICD-10-CM

## 2019-03-31 LAB — NOVEL CORONAVIRUS, NAA: SARS-CoV-2, NAA: NOT DETECTED

## 2020-05-01 ENCOUNTER — Ambulatory Visit: Payer: Self-pay | Admitting: Nurse Practitioner

## 2020-05-16 ENCOUNTER — Other Ambulatory Visit: Payer: Self-pay

## 2020-05-16 ENCOUNTER — Ambulatory Visit (INDEPENDENT_AMBULATORY_CARE_PROVIDER_SITE_OTHER): Payer: No Typology Code available for payment source

## 2020-05-16 ENCOUNTER — Ambulatory Visit (HOSPITAL_COMMUNITY)
Admission: EM | Admit: 2020-05-16 | Discharge: 2020-05-16 | Disposition: A | Discharge status: Home / Self Care | Payer: No Typology Code available for payment source | Attending: Student | Admitting: Student

## 2020-05-16 ENCOUNTER — Encounter (HOSPITAL_COMMUNITY): Payer: Self-pay

## 2020-05-16 DIAGNOSIS — S161XXA Strain of muscle, fascia and tendon at neck level, initial encounter: Secondary | ICD-10-CM | POA: Diagnosis not present

## 2020-05-16 DIAGNOSIS — M542 Cervicalgia: Secondary | ICD-10-CM

## 2020-05-16 DIAGNOSIS — M47812 Spondylosis without myelopathy or radiculopathy, cervical region: Secondary | ICD-10-CM

## 2020-05-16 MED ORDER — MELOXICAM 7.5 MG PO TABS: 7.5000 mg | ORAL_TABLET | Freq: Every day | ORAL | 0 refills | Status: DC

## 2020-05-16 MED ORDER — TIZANIDINE HCL 2 MG PO CAPS: 2.0000 mg | ORAL_CAPSULE | Freq: Three times a day (TID) | ORAL | 0 refills | Status: DC
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
## 2020-05-16 NOTE — Discharge Instructions (Addendum)
-  For your neck muscle pain, start the muscle relaxer- Zanaflex (Tizanidine), up to 3x daily. This can make you drowsy so avoid taking when you have to drive or operate machinery.  -You can also take meloxicam (mobic), one pill daily with food. This is a pain reliever and antiinflammatory, and it shouldn't make you drowsy. Don't take any NSAIDs with this, like ibuprofen or naproxen. You can take tylenol with it, 1000mg  up to 3x daily.  -You can try ice or heat. Neither is better but you might find one helps more than the other.  -Follow-up with PCP if your symptoms persist past 1 week, or if you develop any new symptoms like pain down your arms, lower back pain, etc.  -If you develop unusual drowsiness, confusion, memory changes, the worst headache of your life, etc- head straight to the ED.
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
## 2020-05-22 ENCOUNTER — Other Ambulatory Visit: Payer: Self-pay

## 2020-05-22 ENCOUNTER — Ambulatory Visit: Payer: Commercial Managed Care - PPO | Admitting: Nurse Practitioner

## 2020-05-22 ENCOUNTER — Encounter: Payer: Self-pay | Admitting: Nurse Practitioner

## 2020-05-22 VITALS — BP 138/84 | HR 83 | Temp 98.4°F | Ht 68.0 in | Wt 237.2 lb

## 2020-05-22 DIAGNOSIS — Z6836 Body mass index (BMI) 36.0-36.9, adult: Secondary | ICD-10-CM

## 2020-05-22 DIAGNOSIS — I10 Essential (primary) hypertension: Secondary | ICD-10-CM | POA: Diagnosis not present

## 2020-05-22 DIAGNOSIS — Z7689 Persons encountering health services in other specified circumstances: Secondary | ICD-10-CM | POA: Diagnosis not present

## 2020-05-22 MED ORDER — AMLODIPINE BESYLATE 10 MG PO TABS: 10.0000 mg | ORAL_TABLET | Freq: Every day | ORAL | 0 refills | Status: DC
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
## 2020-05-22 NOTE — Patient Instructions (Signed)

## 2020-05-27 ENCOUNTER — Other Ambulatory Visit: Payer: Self-pay

## 2020-07-01 ENCOUNTER — Other Ambulatory Visit: Payer: Self-pay

## 2020-07-01 ENCOUNTER — Ambulatory Visit: Payer: Commercial Managed Care - PPO | Admitting: Nurse Practitioner

## 2020-07-01 ENCOUNTER — Other Ambulatory Visit: Payer: Self-pay | Admitting: Specialist

## 2020-07-01 ENCOUNTER — Encounter: Payer: Self-pay | Admitting: Nurse Practitioner

## 2020-07-01 VITALS — BP 122/80 | HR 75 | Temp 98.2°F | Ht 67.8 in | Wt 236.4 lb

## 2020-07-01 DIAGNOSIS — R0981 Nasal congestion: Secondary | ICD-10-CM

## 2020-07-01 DIAGNOSIS — Z1152 Encounter for screening for COVID-19: Secondary | ICD-10-CM

## 2020-07-01 DIAGNOSIS — R21 Rash and other nonspecific skin eruption: Secondary | ICD-10-CM | POA: Diagnosis not present

## 2020-07-01 DIAGNOSIS — R040 Epistaxis: Secondary | ICD-10-CM | POA: Diagnosis not present

## 2020-07-01 DIAGNOSIS — M542 Cervicalgia: Secondary | ICD-10-CM

## 2020-07-01 DIAGNOSIS — R519 Headache, unspecified: Secondary | ICD-10-CM | POA: Diagnosis not present

## 2020-07-01 MED ORDER — MOMETASONE FUROATE 0.1 % EX CREA: 1.0000 "application " | TOPICAL_CREAM | Freq: Every day | CUTANEOUS | 0 refills | Status: DC

## 2020-07-01 MED ORDER — AMOXICILLIN-POT CLAVULANATE 875-125 MG PO TABS: 1.0000 | ORAL_TABLET | Freq: Two times a day (BID) | ORAL | 0 refills | Status: AC

## 2020-07-01 NOTE — Progress Notes (Signed)
I,Yamilka Roman [employer] as a Neurosurgeon for [employer], FNP.,have documented all relevant documentation on the behalf of Arnette Felts, FNP,as directed by  Arnette Felts, FNP while in the presence of Arnette Felts, FNP. This visit occurred during the SARS-CoV-2 public health emergency.  Safety protocols were in place, including screening questions prior to the visit, additional usage of staff PPE, and extensive cleaning of exam room while observing appropriate contact time as indicated for disinfecting solutions.  Subjective:     Patient ID: Glenn Carrillo , male    DOB: 1966/02/21 , 55 y.o.   MRN: [id number]   Chief Complaint  Patient presents with  . URI    Patient stated he has been having some congestion, sinus pain in his forehead and around his nose is sore. He had a nose bleed yesterday morning. His symptoms have been going on for 4-5 days.     HPI  Patient presents today for sinus symptoms. He stated he has had some nasal congestion, sinus pain in his forehead and around his nose for the past 4-5 days. He has tried zyrtec and mucinex. Also uses [employer] brand flonase type nasal spray which has helped.    URI  This is a new problem. The current episode started in the past 7 days. The problem has been gradually worsening. There has been no fever. Associated symptoms include congestion, headaches and sneezing. Pertinent negatives include no coughing, nausea, rhinorrhea, sore throat or wheezing.     Past Medical History:  Diagnosis Date  . Hypertension      Family History  Problem Relation Age of Onset  . Hypertension Other      Current Outpatient Medications:  .  amLODipine (NORVASC) 10 MG tablet, Take 1 tablet (10 mg total) by mouth daily., Disp: 60 tablet, Rfl: 0 .  amoxicillin-clavulanate (AUGMENTIN) 875-125 MG tablet, Take 1 tablet by mouth 2 (two) times daily for 10 days., Disp: 20 tablet, Rfl: 0 .  cetirizine (ZYRTEC) 10 MG tablet, Take 10 mg by mouth daily as needed for  allergies., Disp: , Rfl:  .  cholecalciferol (VITAMIN D) 1000 units tablet, Take 1,000 Units by mouth daily., Disp: , Rfl:  .  meloxicam (MOBIC) 7.5 MG tablet, Take 1 tablet (7.5 mg total) by mouth daily., Disp: 14 tablet, Rfl: 0 .  mometasone (ELOCON) 0.1 % cream, Apply 1 application topically daily., Disp: 45 g, Rfl: 0 .  Olopatadine HCl 0.7 % SOLN, Apply 1 drop to eye daily as needed., Disp: , Rfl:  .  tizanidine (ZANAFLEX) 2 MG capsule, Take 1 capsule (2 mg total) by mouth 3 (three) times daily., Disp: 21 capsule, Rfl: 0   Allergies  Allergen Reactions  . Chlorhexidine Itching  . Prednisone     ACHY JOINTS     Review of Systems  HENT: Positive for congestion, nosebleeds (yesterday), postnasal drip, sinus pressure and sneezing. Negative for rhinorrhea and sore throat.   Respiratory: Negative for cough and wheezing.   Cardiovascular: Negative.   Gastrointestinal: Negative for nausea.  Neurological: Positive for headaches. Negative for dizziness.  Psychiatric/Behavioral: Negative.      Today's Vitals   07/01/20 1458  BP: 122/80  Pulse: 75  Temp: 98.2 F (36.8 C)  TempSrc: Oral  Weight: 236 lb 6.4 oz (107.2 kg)  Height: 5' 7.8" (1.722 m)  PainSc: 4   PainLoc: Face   Body mass index is 36.16 kg/m.   Objective:  Physical Exam Constitutional:      General: He is not  in acute distress.    Appearance: Normal appearance.  Cardiovascular:     Rate and Rhythm: Regular rhythm.     Pulses: Normal pulses.     Heart sounds: Normal heart sounds. No murmur heard.   Pulmonary:     Effort: Pulmonary effort is normal. No respiratory distress.     Breath sounds: Normal breath sounds. No wheezing.  Neurological:     General: No focal deficit present.     Mental Status: He is alert and oriented to person, place, and time.     Cranial Nerves: No cranial nerve deficit.  Psychiatric:        Mood and Affect: Mood normal.        Behavior: Behavior normal.        Thought Content:  Thought content normal.        Judgment: Judgment normal.         Assessment And Plan:     1. Sinus congestion - amoxicillin-clavulanate (AUGMENTIN) 875-125 MG tablet; Take 1 tablet by mouth 2 (two) times daily for 10 days.  Dispense: 20 tablet; Refill: 0  2. Acute nonintractable headache, unspecified headache type  Likely related to possible sinus infection - amoxicillin-clavulanate (AUGMENTIN) 875-125 MG tablet; Take 1 tablet by mouth 2 (two) times daily for 10 days.  Dispense: 20 tablet; Refill: 0  3. Bleeding nose  May be related possible sinus infection - amoxicillin-clavulanate (AUGMENTIN) 875-125 MG tablet; Take 1 tablet by mouth 2 (two) times daily for 10 days.  Dispense: 20 tablet; Refill: 0  4. Rash and nonspecific skin eruption  - mometasone (ELOCON) 0.1 % cream; Apply 1 application topically daily.  Dispense: 45 g; Refill: 0  5. Encounter for screening for COVID-19 COVID-19 Education: The signs and symptoms of COVID-19 were discussed with the patient and how to seek care for testing (follow up with PCP or arrange E-visit).  The importance of social distancing was discussed today.   Patient Risk:   After full review of this patients clinical status, I feel that they are at least moderate risk at this time.  - Novel Coronavirus, NAA ([facility])     Patient was given opportunity to ask questions. Patient verbalized understanding of the plan and was able to repeat key elements of the plan. All questions were answered to their satisfaction.  Arnette Felts, FNP   I, Arnette Felts, FNP, have reviewed all documentation for this visit. The documentation on 07/10/20 for the exam, diagnosis, procedures, and orders are all accurate and complete.   IF YOU HAVE BEEN REFERRED TO A SPECIALIST, IT MAY TAKE 1-2 WEEKS TO SCHEDULE/PROCESS THE REFERRAL. IF YOU HAVE NOT HEARD FROM US/SPECIALIST IN TWO WEEKS, PLEASE GIVE [place] A CALL AT [phone] X [phone].   THE PATIENT IS ENCOURAGED TO  PRACTICE SOCIAL DISTANCING DUE TO THE COVID-19 PANDEMIC.

## 2020-07-02 LAB — SARS-COV-2, NAA 2 DAY TAT

## 2020-07-02 LAB — NOVEL CORONAVIRUS, NAA: SARS-CoV-2, NAA: NOT DETECTED

## 2020-07-13 ENCOUNTER — Other Ambulatory Visit: Payer: Self-pay

## 2020-07-13 ENCOUNTER — Ambulatory Visit
Admission: RE | Admit: 2020-07-13 | Discharge: 2020-07-13 | Disposition: A | Discharge status: Home / Self Care | Payer: Self-pay | Source: Ambulatory Visit | Attending: Specialist | Admitting: Specialist

## 2020-07-13 DIAGNOSIS — M542 Cervicalgia: Secondary | ICD-10-CM

## 2020-08-28 ENCOUNTER — Other Ambulatory Visit: Payer: Self-pay

## 2020-08-28 ENCOUNTER — Encounter: Payer: Self-pay | Admitting: Nurse Practitioner

## 2020-08-28 ENCOUNTER — Ambulatory Visit (INDEPENDENT_AMBULATORY_CARE_PROVIDER_SITE_OTHER): Payer: Commercial Managed Care - PPO | Admitting: Nurse Practitioner

## 2020-08-28 VITALS — BP 130/80 | HR 83 | Temp 98.1°F | Ht 68.8 in | Wt 236.0 lb

## 2020-08-28 DIAGNOSIS — E559 Vitamin D deficiency, unspecified: Secondary | ICD-10-CM | POA: Diagnosis not present

## 2020-08-28 DIAGNOSIS — Z0000 Encounter for general adult medical examination without abnormal findings: Secondary | ICD-10-CM | POA: Diagnosis not present

## 2020-08-28 DIAGNOSIS — I10 Essential (primary) hypertension: Secondary | ICD-10-CM | POA: Diagnosis not present

## 2020-08-28 DIAGNOSIS — Z6835 Body mass index (BMI) 35.0-35.9, adult: Secondary | ICD-10-CM

## 2020-08-28 DIAGNOSIS — Z1211 Encounter for screening for malignant neoplasm of colon: Secondary | ICD-10-CM

## 2020-08-28 DIAGNOSIS — R5383 Other fatigue: Secondary | ICD-10-CM | POA: Diagnosis not present

## 2020-08-28 LAB — POCT URINALYSIS DIPSTICK
Blood, UA: NEGATIVE
Glucose, UA: NEGATIVE
Ketones, UA: NEGATIVE
Leukocytes, UA: NEGATIVE
Nitrite, UA: NEGATIVE
Protein, UA: NEGATIVE
Spec Grav, UA: >=1.03 — AB (ref 1.010–1.025)
Urobilinogen, UA: 0.2 E.U./dL
pH, UA: 5.5 (ref 5.0–8.0)

## 2020-08-28 LAB — POCT UA - MICROALBUMIN
Albumin/Creatinine Ratio, Urine, POC: 30
Creatinine, POC: 300 mg/dL
Microalbumin Ur, POC: 10 mg/L

## 2020-08-28 NOTE — Patient Instructions (Signed)

## 2020-08-28 NOTE — Progress Notes (Signed)
I,[name],acting as a Education administrator for [employer], NP.,have documented all relevant documentation on the behalf of [employer], NP,as directed by  Bary Castilla, NP while in the presence of Bary Castilla, NP.  This visit occurred during the SARS-CoV-2 public health emergency.  Safety protocols were in place, including screening questions prior to the visit, additional usage of staff PPE, and extensive cleaning of exam room while observing appropriate contact time as indicated for disinfecting solutions.  Subjective:     Patient ID: Glenn Carrillo , male    DOB: 24-Dec-1965 , 55 y.o.   MRN: [id number]   Chief Complaint  Patient presents with   Annual Exam    HPI  Patient is here for physical exam. He has no concerns at this time.  Diet: he is not on on a diet. He is trying to eat healthy.  Exercise: He has not been exercising.  He starts to go back to the work at the end of the month so he is ready to get his health back on track.  He goes to the dentist and eye doctor every year.  Smoke: No  Drink: occasionally       Past Medical History:  Diagnosis Date   Hypertension      Family History  Problem Relation Age of Onset   Hypertension Other      Current Outpatient Medications:    amLODipine (NORVASC) 10 MG tablet, Take 1 tablet (10 mg total) by mouth daily., Disp: 60 tablet, Rfl: 0   cetirizine (ZYRTEC) 10 MG tablet, Take 10 mg by mouth daily as needed for allergies., Disp: , Rfl:    cholecalciferol (VITAMIN D) 1000 units tablet, Take 1,000 Units by mouth daily., Disp: , Rfl:    meloxicam (MOBIC) 7.5 MG tablet, Take 1 tablet (7.5 mg total) by mouth daily., Disp: 14 tablet, Rfl: 0   mometasone (ELOCON) 0.1 % cream, Apply 1 application topically daily., Disp: 45 g, Rfl: 0   Olopatadine HCl 0.7 % SOLN, Apply 1 drop to eye daily as needed., Disp: , Rfl:    tizanidine (ZANAFLEX) 2 MG capsule, Take 1 capsule (2 mg total) by mouth 3 (three) times daily., Disp:  21 capsule, Rfl: 0   Allergies  Allergen Reactions   Chlorhexidine Itching   Prednisone     ACHY JOINTS     Men's preventive visit. Patient Health Questionnaire (PHQ-2) is  [place] Office Visit from 05/22/2020 in [facility]  PHQ-2 Total Score 0     . Patient is on a not on any diet. Marital status: Married. Relevant history for alcohol use is:  Social History   Substance and Sexual Activity  Alcohol Use Yes   Comment: weekly  . Relevant history for tobacco use is:  Social History   Tobacco Use  Smoking Status Former   Pack years: 0.00   Types: Cigarettes   Quit date: 11/16/1997   Years since quitting: 22.7  Smokeless Tobacco Never  .   Review of Systems  Constitutional: Negative.  Negative for chills and fatigue.  HENT: Negative.    Eyes: Negative.   Respiratory: Negative.  Negative for chest tightness, shortness of breath and wheezing.   Cardiovascular: Negative.  Negative for chest pain and palpitations.  Gastrointestinal: Negative.   Endocrine: Negative for polydipsia, polyphagia and polyuria.  Genitourinary: Negative.  Negative for flank pain.  Musculoskeletal: Negative.  Negative for back pain and myalgias.  Skin: Negative.   Allergic/Immunologic: Negative.   Neurological: Negative.  Hematological: Negative.   Psychiatric/Behavioral: Negative.      Today's Vitals   08/28/20 1412  BP: 130/80  Pulse: 83  Temp: 98.1 F (36.7 C)  TempSrc: Oral  Weight: 236 lb (107 kg)  Height: 5' 8.8" (1.748 m)   Body mass index is 35.05 kg/m.  Wt Readings from Last 3 Encounters:  08/28/20 236 lb (107 kg)  07/01/20 236 lb 6.4 oz (107.2 kg)  05/22/20 237 lb 3.2 oz (107.6 kg)    Objective:  Physical Exam Vitals and nursing note reviewed.  Constitutional:      Appearance: Normal appearance.  HENT:     Head: Normocephalic and atraumatic.     Right Ear: Tympanic membrane, ear canal and external ear normal. There is no impacted cerumen.      Left Ear: Tympanic membrane, ear canal and external ear normal. There is no impacted cerumen.     Nose: Nose normal.     Mouth/Throat:     Mouth: Mucous membranes are moist.     Pharynx: Oropharynx is clear.  Eyes:     Extraocular Movements: Extraocular movements intact.     Conjunctiva/sclera: Conjunctivae normal.     Pupils: Pupils are equal, round, and reactive to light.  Cardiovascular:     Rate and Rhythm: Normal rate and regular rhythm.     Pulses: Normal pulses.     Heart sounds: Normal heart sounds.  Pulmonary:     Effort: Pulmonary effort is normal. No respiratory distress.     Breath sounds: Normal breath sounds. No wheezing.  Chest:  Breasts:    Right: Normal. No swelling, bleeding, inverted nipple, mass or nipple discharge.     Left: Normal. No swelling, bleeding, inverted nipple, mass or nipple discharge.  Abdominal:     General: Abdomen is flat. Bowel sounds are normal.     Palpations: Abdomen is soft.     Tenderness: There is no guarding.  Genitourinary:    Rectum: Guaiac result negative.     Comments: Defered. Will do PSA  Musculoskeletal:        General: Normal range of motion.     Cervical back: Normal range of motion and neck supple.  Skin:    General: Skin is warm.     Capillary Refill: Capillary refill takes less than 2 seconds.  Neurological:     General: No focal deficit present.     Mental Status: He is alert.  Psychiatric:        Mood and Affect: Mood normal.        Behavior: Behavior normal.        Assessment And Plan:    1. Encounter for annual physical exam --Patient is here for their annual physical exam and we discussed any changes to medication and medical history.  -Behavior modification was discussed as well as diet and exercise history  -Patient will continue to exercise regularly and modify their diet.  -Recommendation for yearly physical annuals, immunization and screenings including mammogram and colonoscopy were discussed with  the patient.  -Recommended intake of multivitamin, vitamin D and calcium.  -Individualized advise was given to the patient pertaining to their own health history in regards to diet, exercise, medical condition and referrals.  - CBC - Hemoglobin A1c - CMP14+EGFR - Lipid panel - PSA - Hepatitis C antibody - HIV Antibody (routine testing w rflx)  2. Essential hypertension -Chronic, stable, continue meds -Today BP was 130/80 - POCT Urinalysis Dipstick (81002) - POCT UA - Microalbumin -  EKG 12-Lead- NSR   3. Vitamin D deficiency -Will check and supplement if needed. Advised patient to spend atleast 15 min. Daily in sunlight.  -Vitamin D (25 hydroxy)  4. Fatigue, unspecified type -Will check thyroid level and assess.  - TSH + free T4  5. Encounter for screening colonoscopy - Ambulatory referral to Gastroenterology  6. Class 2 severe obesity due to excess calories with serious comorbidity and body mass index (BMI) of 35.0 to 35.9 in adult [facility]) Advised patient on a healthy diet including avoiding fast food and red meats. Increase the intake of lean meats including grilled chicken and [place].  Drink a lot of water. Decrease intake of fatty foods. Exercise for 30-45 min. 4-5 a week to decrease the risk of cardiac event.   The patient was encouraged to call or send a message through [place] for any questions or concerns.   Side effects and appropriate use of all the medication(s) were discussed with the patient today. Patient advised to use the medication(s) as directed by their healthcare provider. The patient was encouraged to read, review, and understand all associated package inserts and contact our office with any questions or concerns. The patient accepts the risks of the treatment plan and had an opportunity to ask questions.   Follow up: 3 months HTN check    Patient was given opportunity to ask questions. Patient verbalized understanding of the plan and was able to repeat key  elements of the plan. All questions were answered to their satisfaction.  Raman Ladell Bey, DNP   I, Raman Nabor Thomann have reviewed all documentation for this visit. The documentation on 08/28/20 for the exam, diagnosis, procedures, and orders are all accurate and complete.    THE PATIENT IS ENCOURAGED TO PRACTICE SOCIAL DISTANCING DUE TO THE COVID-19 PANDEMIC.

## 2020-08-29 ENCOUNTER — Telehealth: Payer: Self-pay

## 2020-08-29 LAB — CBC
Hematocrit: 43.4 % (ref 37.5–51.0)
Hemoglobin: 14.5 g/dL (ref 13.0–17.7)
MCH: 29.5 pg (ref 26.6–33.0)
MCHC: 33.4 g/dL (ref 31.5–35.7)
MCV: 88 fL (ref 79–97)
Platelets: 211 10*3/uL (ref 150–450)
RBC: 4.91 x10E6/uL (ref 4.14–5.80)
RDW: 13.7 % (ref 11.6–15.4)
WBC: 5.8 10*3/uL (ref 3.4–10.8)

## 2020-08-29 LAB — LIPID PANEL
Chol/HDL Ratio: 4.4 ratio (ref 0.0–5.0)
Cholesterol, Total: 245 mg/dL — ABNORMAL HIGH (ref 100–199)
HDL: 56 mg/dL (ref >39)
LDL Chol Calc (NIH): 137 mg/dL — ABNORMAL HIGH (ref 0–99)
Triglycerides: 292 mg/dL — ABNORMAL HIGH (ref 0–149)
VLDL Cholesterol Cal: 52 mg/dL — ABNORMAL HIGH (ref 5–40)

## 2020-08-29 LAB — CMP14+EGFR
ALT: 17 IU/L (ref 0–44)
AST: 14 IU/L (ref 0–40)
Albumin/Globulin Ratio: 1.7 (ref 1.2–2.2)
Albumin: 4.4 g/dL (ref 3.8–4.9)
Alkaline Phosphatase: 57 IU/L (ref 44–121)
BUN/Creatinine Ratio: 11 (ref 9–20)
BUN: 12 mg/dL (ref 6–24)
Bilirubin Total: 0.4 mg/dL (ref 0.0–1.2)
CO2: 22 mmol/L (ref 20–29)
Calcium: 9.9 mg/dL (ref 8.7–10.2)
Chloride: 101 mmol/L (ref 96–106)
Creatinine, Ser: 1.14 mg/dL (ref 0.76–1.27)
Globulin, Total: 2.6 g/dL (ref 1.5–4.5)
Glucose: 128 mg/dL — ABNORMAL HIGH (ref 65–99)
Potassium: 4.2 mmol/L (ref 3.5–5.2)
Sodium: 140 mmol/L (ref 134–144)
Total Protein: 7 g/dL (ref 6.0–8.5)
eGFR: 75 mL/min/{1.73_m2} (ref >59)

## 2020-08-29 LAB — HIV ANTIBODY (ROUTINE TESTING W REFLEX): HIV Screen 4th Generation wRfx: NONREACTIVE

## 2020-08-29 LAB — TSH+FREE T4
Free T4: 1.45 ng/dL (ref 0.82–1.77)
TSH: 2.58 u[IU]/mL (ref 0.450–4.500)

## 2020-08-29 LAB — HEMOGLOBIN A1C
Est. average glucose Bld gHb Est-mCnc: 131 mg/dL
Hgb A1c MFr Bld: 6.2 % — ABNORMAL HIGH (ref 4.8–5.6)

## 2020-08-29 LAB — HEPATITIS C ANTIBODY: Hep C Virus Ab: <0.1 s/co ratio (ref 0.0–0.9)

## 2020-08-29 LAB — PSA: Prostate Specific Ag, Serum: 0.9 ng/mL (ref 0.0–4.0)

## 2020-08-29 LAB — VITAMIN D 25 HYDROXY (VIT D DEFICIENCY, FRACTURES): Vit D, 25-Hydroxy: 27.5 ng/mL — ABNORMAL LOW (ref 30.0–100.0)

## 2020-08-29 NOTE — Telephone Encounter (Signed)
Home phone disconnected, no voicemail came on for cell number.

## 2020-08-29 NOTE — Telephone Encounter (Signed)
-----   Message from [name], NP sent at 08/28/2020  4:11 PM EDT ----- Make sure you area staying well hydrated with water.

## 2020-09-01 ENCOUNTER — Other Ambulatory Visit: Payer: Self-pay | Admitting: Nurse Practitioner

## 2020-09-01 DIAGNOSIS — E782 Mixed hyperlipidemia: Secondary | ICD-10-CM

## 2020-09-01 DIAGNOSIS — E559 Vitamin D deficiency, unspecified: Secondary | ICD-10-CM

## 2020-09-01 MED ORDER — ATORVASTATIN CALCIUM 10 MG PO TABS: 10.0000 mg | ORAL_TABLET | Freq: Every day | ORAL | 2 refills | Status: DC

## 2020-09-01 MED ORDER — VITAMIN D (ERGOCALCIFEROL) 1.25 MG (50000 UNIT) PO CAPS: 50000.0000 [IU] | ORAL_CAPSULE | ORAL | 0 refills | Status: DC

## 2020-09-01 NOTE — Progress Notes (Signed)
Prescription sent to pharmacy.

## 2020-09-23 ENCOUNTER — Ambulatory Visit: Payer: Commercial Managed Care - PPO | Admitting: Nurse Practitioner

## 2020-09-23 ENCOUNTER — Encounter: Payer: Self-pay | Admitting: Nurse Practitioner

## 2020-09-23 ENCOUNTER — Other Ambulatory Visit: Payer: Self-pay

## 2020-09-23 VITALS — BP 136/78 | HR 86 | Temp 98.1°F | Ht 68.0 in | Wt 237.0 lb

## 2020-09-23 DIAGNOSIS — K219 Gastro-esophageal reflux disease without esophagitis: Secondary | ICD-10-CM

## 2020-09-23 DIAGNOSIS — I10 Essential (primary) hypertension: Secondary | ICD-10-CM

## 2020-09-23 DIAGNOSIS — Z6836 Body mass index (BMI) 36.0-36.9, adult: Secondary | ICD-10-CM

## 2020-09-23 MED ORDER — OMEPRAZOLE MAGNESIUM 20 MG PO TBEC: 20.0000 mg | DELAYED_RELEASE_TABLET | Freq: Every day | ORAL | 2 refills | Status: DC

## 2020-09-23 NOTE — Patient Instructions (Signed)

## 2020-09-23 NOTE — Progress Notes (Signed)
I,[name],acting as a Neurosurgeon for [employer], NP.,have documented all relevant documentation on the behalf of [employer], NP,as directed by  [name], NP while in the presence of [name], NP.  This visit occurred during the SARS-CoV-2 public health emergency.  Safety protocols were in place, including screening questions prior to the visit, additional usage of staff PPE, and extensive cleaning of exam room while observing appropriate contact time as indicated for disinfecting solutions.  Subjective:     Patient ID: Glenn Carrillo , male    DOB: [birth year] , 55 y.o.   MRN: [id number]   Chief Complaint  Patient presents with   Heartburn    HPI  The patient is here for heartburn. His in office BP reading was elevated at 150/80. He reports he has been working outside all day and just took his medication 1 hour - 1.5 ago. He states he is starting to go back to the gym and does not want to add on a medication right now. His second reading in the office was 136/78   Heartburn He complains of heartburn. He reports no chest pain, no coughing or no wheezing. This is a new problem. The current episode started in the past 7 days. The problem occurs constantly. The problem has been resolved. The heartburn duration is more than one hour. The heartburn is located in the abdomen. The heartburn is of severe intensity. The heartburn wakes him from sleep. The heartburn limits his activity. The heartburn doesn't change with position. The symptoms are aggravated by certain foods. He has tried an antacid for the symptoms.    Past Medical History:  Diagnosis Date   Hypertension      Family History  Problem Relation Age of Onset   Hypertension Other      Current Outpatient Medications:    omeprazole (PRILOSEC OTC) 20 MG tablet, Take 1 tablet (20 mg total) by mouth daily., Disp: 30 tablet, Rfl: 2   amLODipine (NORVASC) 10 MG tablet, Take 1 tablet (10 mg total) by mouth  daily., Disp: 60 tablet, Rfl: 0   atorvastatin (LIPITOR) 10 MG tablet, Take 1 tablet (10 mg total) by mouth daily., Disp: 30 tablet, Rfl: 2   cetirizine (ZYRTEC) 10 MG tablet, Take 10 mg by mouth daily as needed for allergies., Disp: , Rfl:    cholecalciferol (VITAMIN D) 1000 units tablet, Take 1,000 Units by mouth daily., Disp: , Rfl:    meloxicam (MOBIC) 7.5 MG tablet, Take 1 tablet (7.5 mg total) by mouth daily., Disp: 14 tablet, Rfl: 0   mometasone (ELOCON) 0.1 % cream, Apply 1 application topically daily., Disp: 45 g, Rfl: 0   Olopatadine HCl 0.7 % SOLN, Apply 1 drop to eye daily as needed., Disp: , Rfl:    tizanidine (ZANAFLEX) 2 MG capsule, Take 1 capsule (2 mg total) by mouth 3 (three) times daily., Disp: 21 capsule, Rfl: 0   Vitamin D, Ergocalciferol, (DRISDOL) 1.25 MG (50000 UNIT) CAPS capsule, Take 1 capsule (50,000 Units total) by mouth every 7 (seven) days., Disp: 12 capsule, Rfl: 0   Allergies  Allergen Reactions   Chlorhexidine Itching   Prednisone     ACHY JOINTS     Review of Systems  Constitutional: Negative.  Negative for chills and fever.  HENT:  Negative for congestion, rhinorrhea and sinus pain.   Respiratory: Negative.  Negative for apnea, cough, shortness of breath and wheezing.   Cardiovascular: Negative.  Negative for chest pain and palpitations.  Gastrointestinal:  Positive for heartburn.       Indigestion   Neurological: Negative.  Negative for weakness, numbness and headaches.    Today's Vitals   09/23/20 1618  BP: 136/78  Pulse: 86  Temp: 98.1 F (36.7 C)  TempSrc: Oral  Weight: 237 lb (107.5 kg)  Height: 5\' 8"  (1.727 m)   Body mass index is 36.04 kg/m.   Objective:  Physical Exam Constitutional:      Appearance: Normal appearance.  HENT:     Head: Normocephalic and atraumatic.  Cardiovascular:     Rate and Rhythm: Normal rate and regular rhythm.     Pulses: Normal pulses.     Heart sounds: Normal heart sounds. No murmur heard. Pulmonary:      Effort: Pulmonary effort is normal. No respiratory distress.     Breath sounds: Normal breath sounds. No wheezing.  Skin:    General: Skin is warm and dry.     Capillary Refill: Capillary refill takes less than 2 seconds.  Neurological:     Mental Status: He is alert and oriented to person, place, and time.        Assessment And Plan:     1. Gastroesophageal reflux disease without esophagitis - omeprazole (PRILOSEC OTC) 20 MG tablet; Take 1 tablet (20 mg total) by mouth daily.  Dispense: 30 tablet; Refill: 2 -Avoid certain foods that trigger GERD such as acidic foods like tomatoes, pizza, chocolate, wine and caffeine.   2. Primary hypertension His in office BP reading was elevated at 150/80. He reports he has been working outside all day and just took his medication 1 hour - 1.5 ago. He states he is starting to go back to the gym and does not want to add on a medication right now. His second reading in the office was 136/78. -Continue taking Amlodipine 10 mg. Once daily  -Limit the intake of processed foods and salt intake. You should increase your intake of green vegetables and fruits. Limit the use of alcohol. Limit fast foods and fried foods. Avoid high fatty saturated and trans fat foods. Keep yourself hydrated with drinking water. Avoid red meats. Eat lean meats instead. Exercise for atleast 30-45 min for atleast 4-5 times a week.    3. Class 2 severe obesity due to excess calories with serious comorbidity and body mass index (BMI) of 36.0 to 36.9 in adult [facility])  Advised patient on a healthy diet including avoiding fast food and red meats. Increase the intake of lean meats including grilled chicken and [facility].  Drink a lot of water. Decrease intake of fatty foods. Exercise for 30-45 min. 4-5 a week to decrease the risk of cardiac event.   Limit the intake of processed foods and salt intake. You should increase your intake of green vegetables and fruits. Limit the use of alcohol.  Limit fast foods and fried foods. Avoid high fatty saturated and trans fat foods. Keep yourself hydrated with drinking water. Avoid red meats. Eat lean meats instead. Exercise for atleast 30-45 min for atleast 4-5 times a week.   Follow up: if symptoms persist or do not get better.   Side effects and appropriate use of all the medication(s) were discussed with the patient today. Patient advised to use the medication(s) as directed by their healthcare provider. The patient was encouraged to read, review, and understand all associated package inserts and contact our office with any questions or concerns. The patient accepts the risks of the treatment plan and had an opportunity  to ask questions.   Patient was given opportunity to ask questions. Patient verbalized understanding of the plan and was able to repeat key elements of the plan. All questions were answered to their satisfaction.  Glenn Armondo Cech, DNP   I, Glenn Demarquez Ciolek have reviewed all documentation for this visit. The documentation on 09/23/20 for the exam, diagnosis, procedures, and orders are all accurate and complete.    IF YOU HAVE BEEN REFERRED TO A SPECIALIST, IT MAY TAKE 1-2 WEEKS TO SCHEDULE/PROCESS THE REFERRAL. IF YOU HAVE NOT HEARD FROM US/SPECIALIST IN TWO WEEKS, PLEASE GIVE [place] A CALL AT [phone].   THE PATIENT IS ENCOURAGED TO PRACTICE SOCIAL DISTANCING DUE TO THE COVID-19 PANDEMIC.

## 2020-09-30 ENCOUNTER — Other Ambulatory Visit: Payer: Self-pay

## 2020-09-30 ENCOUNTER — Ambulatory Visit: Payer: Commercial Managed Care - PPO | Admitting: Nurse Practitioner

## 2020-09-30 ENCOUNTER — Encounter: Payer: Self-pay | Admitting: Nurse Practitioner

## 2020-09-30 VITALS — BP 142/88 | HR 76 | Temp 98.1°F | Ht 68.0 in | Wt 238.2 lb

## 2020-09-30 DIAGNOSIS — R202 Paresthesia of skin: Secondary | ICD-10-CM | POA: Diagnosis not present

## 2020-09-30 DIAGNOSIS — Z6836 Body mass index (BMI) 36.0-36.9, adult: Secondary | ICD-10-CM

## 2020-09-30 DIAGNOSIS — I10 Essential (primary) hypertension: Secondary | ICD-10-CM

## 2020-09-30 MED ORDER — ISOSORB DINITRATE-HYDRALAZINE 20-37.5 MG PO TABS: 0.5000 | ORAL_TABLET | Freq: Two times a day (BID) | ORAL | 2 refills | Status: DC

## 2020-09-30 NOTE — Patient Instructions (Addendum)
Hypertension, Adult Hypertension is another name for high blood pressure. High blood pressure forces your heart to work harder to pump blood. This can cause problems overtime. There are two numbers in a blood pressure reading. There is a top number (systolic) over a bottom number (diastolic). It is best to have a blood pressure that is below 120/80. Healthy choicescan help lower your blood pressure, or you may need medicine to help lower it. What are the causes? The cause of this condition is not known. Some conditions may be related tohigh blood pressure. What increases the risk? Smoking. Having type 2 diabetes mellitus, high cholesterol, or both. Not getting enough exercise or physical activity. Being overweight. Having too much fat, sugar, calories, or salt (sodium) in your diet. Drinking too much alcohol. Having long-term (chronic) kidney disease. Having a family history of high blood pressure. Age. Risk increases with age. Race. You may be at higher risk if you are African American. Gender. Men are at higher risk than women before age 20. After age 72, women are at higher risk than men. Having obstructive sleep apnea. Stress. What are the signs or symptoms? High blood pressure may not cause symptoms. Very high blood pressure (hypertensive crisis) may cause: Headache. Feelings of worry or nervousness (anxiety). Shortness of breath. Nosebleed. A feeling of being sick to your stomach (nausea). Throwing up (vomiting). Changes in how you see. Very bad chest pain. Seizures. How is this treated? This condition is treated by making healthy lifestyle changes, such as: Eating healthy foods. Exercising more. Drinking less alcohol. Your health care provider may prescribe medicine if lifestyle changes are not enough to get your blood pressure under control, and if: Your top number is above 130. Your bottom number is above 80. Your personal target blood pressure may vary. Follow these  instructions at home: Eating and drinking  If told, follow the DASH eating plan. To follow this plan: Fill one half of your plate at each meal with fruits and vegetables. Fill one fourth of your plate at each meal with whole grains. Whole grains include whole-wheat pasta, brown rice, and whole-grain bread. Eat or drink low-fat dairy products, such as skim milk or low-fat yogurt. Fill one fourth of your plate at each meal with low-fat (lean) proteins. Low-fat proteins include fish, chicken without skin, eggs, beans, and tofu. Avoid fatty meat, cured and processed meat, or chicken with skin. Avoid pre-made or processed food. Eat less than 1,500 mg of salt each day. Do not drink alcohol if: Your doctor tells you not to drink. You are pregnant, may be pregnant, or are planning to become pregnant. If you drink alcohol: Limit how much you use to: 0-1 drink a day for women. 0-2 drinks a day for men. Be aware of how much alcohol is in your drink. In the [place] one drink equals one 12 oz bottle of beer (355 mL), one 5 oz glass of wine (148 mL), or one 1 oz glass of hard liquor (44 mL).  Lifestyle  Work with your doctor to stay at a healthy weight or to lose weight. Ask your doctor what the best weight is for you. Get at least 30 minutes of exercise most days of the week. This may include walking, swimming, or biking. Get at least 30 minutes of exercise that strengthens your muscles (resistance exercise) at least 3 days a week. This may include lifting weights or doing Pilates. Do not use any products that contain nicotine or tobacco, such as cigarettes,  e-cigarettes, and chewing tobacco. If you need help quitting, ask your doctor. Check your blood pressure at home as told by your doctor. Keep all follow-up visits as told by your doctor. This is important.  Medicines Take over-the-counter and prescription medicines only as told by your doctor. Follow directions carefully. Do not skip doses of  blood pressure medicine. The medicine does not work as well if you skip doses. Skipping doses also puts you at risk for problems. Ask your doctor about side effects or reactions to medicines that you should watch for. Contact a doctor if you: Think you are having a reaction to the medicine you are taking. Have headaches that keep coming back (recurring). Feel dizzy. Have swelling in your ankles. Have trouble with your vision. Get help right away if you: Get a very bad headache. Start to feel mixed up (confused). Feel weak or numb. Feel faint. Have very bad pain in your: Chest. Belly (abdomen). Throw up more than once. Have trouble breathing. Summary Hypertension is another name for high blood pressure. High blood pressure forces your heart to work harder to pump blood. For most people, a normal blood pressure is less than 120/80. Making healthy choices can help lower blood pressure. If your blood pressure does not get lower with healthy choices, you may need to take medicine. This information is not intended to replace advice given to you by your health care provider. Make sure you discuss any questions you have with your healthcare provider. Document Revised: 11/16/2017 Document Reviewed: 11/16/2017 Elsevier Patient Education  2022 [employer]   Take 1/2 tablet of bidil once a day in evening for one week return in 1 week for blood pressure check nurse visit.

## 2020-09-30 NOTE — Progress Notes (Signed)
I,Yamilka Roman [employer] as a Neurosurgeon for [employer], FNP.,have documented all relevant documentation on the behalf of Arnette Felts, FNP,as directed by  Arnette Felts, FNP while in the presence of Arnette Felts, FNP.  This visit occurred during the SARS-CoV-2 public health emergency.  Safety protocols were in place, including screening questions prior to the visit, additional usage of staff PPE, and extensive cleaning of exam room while observing appropriate contact time as indicated for disinfecting solutions.  Subjective:     Patient ID: Glenn Carrillo , male    DOB: 1965/08/18 , 55 y.o.   MRN: [id number]   Chief Complaint  Patient presents with   Hypertension    HPI  Patient presents today for a blood pressure f/u.  Reports his blood pressure went up to 160/100. He had went to an urgent care for his job and his blood pressure was high. Also states he has not been feeling well.  He is exercising with cardio and light weight training.   Wt Readings from Last 3 Encounters: 09/30/20 : 238 lb 3.2 oz (108 kg) 09/23/20 : 237 lb (107.5 kg) 08/28/20 : 236 lb (107 kg)    Hypertension This is a chronic problem. The current episode started more than 1 year ago. The problem has been gradually worsening since onset. The problem is uncontrolled. Pertinent negatives include no anxiety, headaches, palpitations or shortness of breath. Agents associated with hypertension include steroids. Risk factors for coronary artery disease include obesity and sedentary lifestyle. Past treatments include calcium channel blockers. The current treatment provides mild improvement. There is no history of angina or CAD/MI. There is no history of chronic renal disease.    Past Medical History:  Diagnosis Date   Hypertension      Family History  Problem Relation Age of Onset   Hypertension Other      Current Outpatient Medications:    amLODipine (NORVASC) 10 MG tablet, Take 1 tablet (10 mg total) by mouth  daily., Disp: 60 tablet, Rfl: 0   atorvastatin (LIPITOR) 10 MG tablet, Take 1 tablet (10 mg total) by mouth daily., Disp: 30 tablet, Rfl: 2   cetirizine (ZYRTEC) 10 MG tablet, Take 10 mg by mouth daily as needed for allergies., Disp: , Rfl:    cholecalciferol (VITAMIN D) 1000 units tablet, Take 1,000 Units by mouth daily., Disp: , Rfl:    isosorbide-hydrALAZINE (BIDIL) 20-37.5 MG tablet, Take 0.5 tablets by mouth 2 (two) times daily., Disp: 30 tablet, Rfl: 2   meloxicam (MOBIC) 7.5 MG tablet, Take 1 tablet (7.5 mg total) by mouth daily., Disp: 14 tablet, Rfl: 0   mometasone (ELOCON) 0.1 % cream, Apply 1 application topically daily., Disp: 45 g, Rfl: 0   Olopatadine HCl 0.7 % SOLN, Apply 1 drop to eye daily as needed., Disp: , Rfl:    omeprazole (PRILOSEC OTC) 20 MG tablet, Take 1 tablet (20 mg total) by mouth daily., Disp: 30 tablet, Rfl: 2   tizanidine (ZANAFLEX) 2 MG capsule, Take 1 capsule (2 mg total) by mouth 3 (three) times daily., Disp: 21 capsule, Rfl: 0   Vitamin D, Ergocalciferol, (DRISDOL) 1.25 MG (50000 UNIT) CAPS capsule, Take 1 capsule (50,000 Units total) by mouth every 7 (seven) days., Disp: 12 capsule, Rfl: 0   Allergies  Allergen Reactions   Chlorhexidine Itching   Prednisone     ACHY JOINTS     Review of Systems  HENT:         Reports vision disturbances with blurred visit.  Eyes:  Positive for visual disturbance.  Respiratory:  Negative for shortness of breath.   Cardiovascular:  Positive for leg swelling (will occasionally have swelling to his ankles). Negative for palpitations.  Neurological:  Negative for dizziness and headaches.       Parasthesias    Today's Vitals   09/30/20 0936  BP: (!) 142/88  Pulse: 76  Temp: 98.1 F (36.7 C)  Weight: 238 lb 3.2 oz (108 kg)  Height: 5\' 8"  (1.727 m)  PainSc: 0-No pain   Body mass index is 36.22 kg/m.   Objective:  Physical Exam Vitals reviewed.  Constitutional:      General: He is not in acute distress.     Appearance: Normal appearance. He is obese.  Cardiovascular:     Rate and Rhythm: Regular rhythm.     Pulses: Normal pulses.     Heart sounds: Normal heart sounds. No murmur heard. Pulmonary:     Effort: Pulmonary effort is normal. No respiratory distress.     Breath sounds: Normal breath sounds. No wheezing.  Skin:    General: Skin is warm and dry.     Capillary Refill: Capillary refill takes less than 2 seconds.  Neurological:     General: No focal deficit present.     Mental Status: He is alert and oriented to person, place, and time.     Cranial Nerves: No cranial nerve deficit.     Motor: No weakness.  Psychiatric:        Mood and Affect: Mood normal.        Behavior: Behavior normal.        Thought Content: Thought content normal.        Judgment: Judgment normal.        Assessment And Plan:     1. Primary hypertension His blood pressure has been elevated and there is concern with having blurred vision and parasthesia that his blood pressure may have caused symptoms. Will add bidil 1/2 two times a day, discussed side effects to include headaches - isosorbide-hydrALAZINE (BIDIL) 20-37.5 MG tablet; Take 0.5 tablets by mouth 2 (two) times daily.  Dispense: 30 tablet; Refill: 2 - CT Head Wo Contrast; Future  2. Class 2 severe obesity due to excess calories with serious comorbidity and body mass index (BMI) of 36.0 to 36.9 in adult [facility]) Chronic Discussed healthy diet and regular exercise options  Encouraged to exercise at least 150 minutes per week with 2 days of strength training  3. Paresthesia Having tingling to his hand since having an elevated blood pressure - CT Head Wo Contrast; Future     Patient was given opportunity to ask questions. Patient verbalized understanding of the plan and was able to repeat key elements of the plan. All questions were answered to their satisfaction.  [facility], FNP   I, Arnette Felts, FNP, have reviewed all documentation for this  visit. The documentation on 09/30/20 for the exam, diagnosis, procedures, and orders are all accurate and complete.   IF YOU HAVE BEEN REFERRED TO A SPECIALIST, IT MAY TAKE 1-2 WEEKS TO SCHEDULE/PROCESS THE REFERRAL. IF YOU HAVE NOT HEARD FROM US/SPECIALIST IN TWO WEEKS, PLEASE GIVE 12/01/20 A CALL AT [phone].   THE PATIENT IS ENCOURAGED TO PRACTICE SOCIAL DISTANCING DUE TO THE COVID-19 PANDEMIC.

## 2020-10-07 ENCOUNTER — Other Ambulatory Visit: Payer: Self-pay

## 2020-10-07 ENCOUNTER — Ambulatory Visit: Payer: BLUE CROSS/BLUE SHIELD

## 2020-10-07 VITALS — BP 116/84 | HR 73 | Temp 98.1°F | Ht 68.0 in | Wt 240.6 lb

## 2020-10-07 DIAGNOSIS — K219 Gastro-esophageal reflux disease without esophagitis: Secondary | ICD-10-CM

## 2020-10-07 DIAGNOSIS — I10 Essential (primary) hypertension: Secondary | ICD-10-CM

## 2020-10-07 MED ORDER — OMEPRAZOLE MAGNESIUM 20 MG PO TBEC: 20.0000 mg | DELAYED_RELEASE_TABLET | Freq: Every day | ORAL | 2 refills | Status: DC

## 2020-10-07 NOTE — Progress Notes (Signed)
The patient's blood pressure was normal on recheck, moore, NP said for the pt to continue with his current regimen.

## 2020-10-21 ENCOUNTER — Encounter: Payer: Self-pay | Admitting: Nurse Practitioner

## 2020-10-21 ENCOUNTER — Ambulatory Visit (INDEPENDENT_AMBULATORY_CARE_PROVIDER_SITE_OTHER): Payer: Commercial Managed Care - PPO | Admitting: Nurse Practitioner

## 2020-10-21 ENCOUNTER — Other Ambulatory Visit: Payer: Self-pay

## 2020-10-21 VITALS — BP 124/80 | HR 76 | Temp 98.2°F | Ht 68.0 in

## 2020-10-21 DIAGNOSIS — Z20822 Contact with and (suspected) exposure to covid-19: Secondary | ICD-10-CM

## 2020-10-21 DIAGNOSIS — Z9109 Other allergy status, other than to drugs and biological substances: Secondary | ICD-10-CM

## 2020-10-21 DIAGNOSIS — R059 Cough, unspecified: Secondary | ICD-10-CM | POA: Diagnosis not present

## 2020-10-21 DIAGNOSIS — R0981 Nasal congestion: Secondary | ICD-10-CM

## 2020-10-21 LAB — POC COVID19 BINAXNOW: SARS Coronavirus 2 Ag: POSITIVE — AB

## 2020-10-21 MED ORDER — AMOXICILLIN-POT CLAVULANATE 875-125 MG PO TABS: 1.0000 | ORAL_TABLET | Freq: Two times a day (BID) | ORAL | 0 refills | Status: AC

## 2020-10-21 MED ORDER — BENZONATATE 100 MG PO CAPS: 100.0000 mg | ORAL_CAPSULE | Freq: Three times a day (TID) | ORAL | 0 refills | Status: DC | PRN

## 2020-10-21 MED ORDER — GUAIFENESIN-DM 100-10 MG/5ML PO SYRP: 5.0000 mL | ORAL_SOLUTION | ORAL | 0 refills | Status: DC | PRN

## 2020-10-21 NOTE — Patient Instructions (Signed)
10 Things You Can Do to Manage Your COVID-19 Symptoms at Home If you have possible or confirmed COVID-19 Stay home except to get medical care. Monitor your symptoms carefully. If your symptoms get worse, call your healthcare provider immediately. Get rest and stay hydrated. If you have a medical appointment, call the healthcare provider ahead of time and tell them that you have or may have COVID-19. For medical emergencies, call 911 and notify the dispatch personnel that you have or may have COVID-19. Cover your cough and sneezes with a tissue or use the inside of your elbow. Wash your hands often with soap and water for at least 20 seconds or clean your hands with an alcohol-based hand sanitizer that contains at least 60% alcohol. As much as possible, stay in a specific room and away from other people in your home. Also, you should use a separate bathroom, if available. If you need to be around other people in or outside of the home, wear a mask. Avoid sharing personal items with other people in your household, like dishes, towels, and bedding. Clean all surfaces that are touched often, like counters, tabletops, and doorknobs. Use household cleaning sprays or wipes according to the label instructions. cdc.gov/coronavirus 10/05/2019 This information is not intended to replace advice given to you by your health care provider. Make sure you discuss any questions you have with your healthcare provider. Document Revised: 04/25/2020 Document Reviewed: 04/25/2020 Elsevier Patient Education  2022 Elsevier Inc.  

## 2020-10-21 NOTE — Progress Notes (Signed)
I,Katawbba Wiggins,acting as a Neurosurgeon for [employer], NP.,have documented all relevant documentation on the behalf of [employer], NP,as directed by  [name], NP while in the presence of [name], NP.  This visit occurred during the SARS-CoV-2 public health emergency.  Safety protocols were in place, including screening questions prior to the visit, additional usage of staff PPE, and extensive cleaning of exam room while observing appropriate contact time as indicated for disinfecting solutions.  Subjective:     Patient ID: Glenn Carrillo , male    DOB: [birth year] , 55 y.o.   MRN: [id number]   Chief Complaint  Patient presents with   Covid Positive     HPI  The patient is being evaluated for covid symptoms. He said his allergies having been acting up so he thinks its that. He drives a bus. 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 He has slight cough. He feels his sinus are draining and stuffed. Denies SOB, Chest pain. No fever. Has some HA and congestion. He is vaccinated and boosted.     Past Medical History:  Diagnosis Date   Hypertension      Family History  Problem Relation Age of Onset   Hypertension Other      Current Outpatient Medications:    amoxicillin-clavulanate (AUGMENTIN) 875-125 MG tablet, Take 1 tablet by mouth 2 (two) times daily for 7 days., Disp: 14 tablet, Rfl: 0   benzonatate (TESSALON PERLES) 100 MG capsule, Take 1 capsule (100 mg total) by mouth 3 (three) times daily as needed for cough., Disp: 30 capsule, Rfl: 0   guaiFENesin-dextromethorphan (ROBITUSSIN DM) 100-10 MG/5ML syrup, Take 5 mLs by mouth every 4 (four) hours as needed for cough., Disp: 118 mL, Rfl: 0   amLODipine (NORVASC) 10 MG tablet, Take 1 tablet (10 mg total) by mouth daily., Disp: 60 tablet, Rfl: 0   atorvastatin (LIPITOR) 10 MG tablet, Take 1 tablet (10 mg total) by mouth daily., Disp: 30 tablet, Rfl: 2   cetirizine (ZYRTEC) 10 MG tablet, Take 10 mg by mouth daily  as needed for allergies., Disp: , Rfl:    isosorbide-hydrALAZINE (BIDIL) 20-37.5 MG tablet, Take 0.5 tablets by mouth 2 (two) times daily. (Patient not taking: Reported on 10/07/2020), Disp: 30 tablet, Rfl: 2   meloxicam (MOBIC) 7.5 MG tablet, Take 1 tablet (7.5 mg total) by mouth daily. (Patient not taking: Reported on 10/07/2020), Disp: 14 tablet, Rfl: 0   mometasone (ELOCON) 0.1 % cream, Apply 1 application topically daily., Disp: 45 g, Rfl: 0   Olopatadine HCl 0.7 % SOLN, Apply 1 drop to eye daily as needed., Disp: , Rfl:    omeprazole (PRILOSEC OTC) 20 MG tablet, Take 1 tablet (20 mg total) by mouth daily., Disp: 90 tablet, Rfl: 2   tizanidine (ZANAFLEX) 2 MG capsule, Take 1 capsule (2 mg total) by mouth 3 (three) times daily., Disp: 21 capsule, Rfl: 0   Vitamin D, Ergocalciferol, (DRISDOL) 1.25 MG (50000 UNIT) CAPS capsule, Take 1 capsule (50,000 Units total) by mouth every 7 (seven) days., Disp: 12 capsule, Rfl: 0   Allergies  Allergen Reactions   Chlorhexidine Itching   Prednisone     ACHY JOINTS     Review of Systems  Constitutional:  Negative for chills and fever.  HENT:  Positive for congestion, sinus pressure and sinus pain.   Respiratory:  Positive for cough. Negative for choking, chest tightness, shortness of breath and wheezing.   Cardiovascular:  Negative for chest pain and palpitations.  Gastrointestinal:  Negative for constipation, diarrhea, nausea and vomiting.  Musculoskeletal:  Negative for arthralgias.  Neurological:  Positive for headaches. Negative for dizziness.    Today's Vitals   10/21/20 1458  BP: 124/80  Pulse: 76  Temp: 98.2 F (36.8 C)  TempSrc: Oral  Height: 5\' 8"  (1.727 m)   Body mass index is 36.58 kg/m.  Wt Readings from Last 3 Encounters:  10/07/20 240 lb 9.6 oz (109.1 kg)  09/30/20 238 lb 3.2 oz (108 kg)  09/23/20 237 lb (107.5 kg)    BP Readings from Last 3 Encounters:  10/21/20 124/80  10/07/20 116/84  09/30/20 (!) 142/88     Objective:  Physical Exam Constitutional:      Appearance: Normal appearance.  HENT:     Head: Normocephalic and atraumatic.  Cardiovascular:     Rate and Rhythm: Normal rate and regular rhythm.     Pulses: Normal pulses.     Heart sounds: Normal heart sounds. No murmur heard. Pulmonary:     Effort: Pulmonary effort is normal. No respiratory distress.     Breath sounds: Normal breath sounds. No wheezing.  Skin:    General: Skin is warm and dry.     Capillary Refill: Capillary refill takes less than 2 seconds.  Neurological:     Mental Status: He is alert and oriented to person, place, and time.        Assessment And Plan:     1. Exposure to COVID-19 virus - POC COVID-19- tested positive.  Denies chest pain, SOB No fever. Has slight fever, HA and congestion.  -Advised patient to take Vitamin C, D, Zinc. Keep yourself hydrated with a lot of water and rest. Take Delsym for cough and Mucinex. Take Tylenol or pain reliever every 4-6 hours as needed for pain/fever/body ache. Educated patient if symptoms get worse or if she experiences any SOB or pain in her legs to seek immediate emergency care. Continue to monitor your pulse oxygen. Call 12/01/20 if you have any questions. Quarantine for 5 days if tested positive and no symptoms or 10 days if tested positive and have symptoms. Wear a mask around other people.  -Advised patient to call us if his symptoms get any worse or if he starts to develop SOB or chest pain to go to the ED. Patent verbalized understanding.   2. Cough - guaiFENesin-dextromethorphan (ROBITUSSIN DM) 100-10 MG/5ML syrup; Take 5 mLs by mouth every 4 (four) hours as needed for cough.  Dispense: 118 mL; Refill: 0 - benzonatate (TESSALON PERLES) 100 MG capsule; Take 1 capsule (100 mg total) by mouth 3 (three) times daily as needed for cough.  Dispense: 30 capsule; Refill: 0  3. Sinus congestion -Will go ahead treat with antx for his sinus congestion and pain along with  drainage.  - amoxicillin-clavulanate (AUGMENTIN) 875-125 MG tablet; Take 1 tablet by mouth 2 (two) times daily for 7 days.  Dispense: 14 tablet; Refill: 0  4. Environmental allergies  -Advised patient to take OTC allergy medication including Cetrizine and Xyzal.   The patient was encouraged to call or send a message through MyChart for any questions or concerns.   Follow up: if symptoms persist or do not get better.   Side effects and appropriate use of all the medication(s) were discussed with the patient today. Patient advised to use the medication(s) as directed by their healthcare provider. The patient was encouraged to read, review, and understand all associated package inserts and contact our office with any questions or  concerns. The patient accepts the risks of the treatment plan and had an opportunity to ask questions.   Advised patient to take Vitamin C, D, Zinc. . Keep yourself hydrated with a lot of water and rest. Take Delsym for cough and Mucinex. Take Tylenol or pain reliever every 4-6 hours as needed for pain/fever/body ache. Educated patient if symptoms get worse or if she experiences any SOB or pain in her legs to seek immediate emergency care. Continue to monitor your pulse oxygen. Call us if you have any questions. Quarantine for 5 days if tested positive and no symptoms or 10 days if tested positive and have symptoms. Wear a mask around other people.   Patient was given opportunity to ask questions. Patient verbalized understanding of the plan and was able to repeat key elements of the plan. All questions were answered to their satisfaction.  Glenn Jakelin Taussig, DNP   I, Glenn Carrillo have reviewed all documentation for this visit. The documentation on 10/21/20 for the exam, diagnosis, procedures, and orders are all accurate and complete.    IF YOU HAVE BEEN REFERRED TO A SPECIALIST, IT MAY TAKE 1-2 WEEKS TO SCHEDULE/PROCESS THE REFERRAL. IF YOU HAVE NOT HEARD FROM US/SPECIALIST IN  TWO WEEKS, PLEASE GIVE [place] A CALL AT [phone].   THE PATIENT IS ENCOURAGED TO PRACTICE SOCIAL DISTANCING DUE TO THE COVID-19 PANDEMIC.

## 2020-10-22 ENCOUNTER — Other Ambulatory Visit: Payer: Self-pay

## 2020-11-12 ENCOUNTER — Other Ambulatory Visit: Payer: Self-pay

## 2020-11-12 ENCOUNTER — Ambulatory Visit
Admission: RE | Admit: 2020-11-12 | Discharge: 2020-11-12 | Disposition: A | Discharge status: Home / Self Care | Payer: Self-pay | Source: Ambulatory Visit | Attending: Nurse Practitioner | Admitting: Nurse Practitioner

## 2020-11-12 DIAGNOSIS — I10 Essential (primary) hypertension: Secondary | ICD-10-CM

## 2020-11-12 DIAGNOSIS — R202 Paresthesia of skin: Secondary | ICD-10-CM

## 2020-11-23 ENCOUNTER — Other Ambulatory Visit: Payer: Self-pay | Admitting: Nurse Practitioner

## 2020-11-23 DIAGNOSIS — I10 Essential (primary) hypertension: Secondary | ICD-10-CM

## 2020-12-04 ENCOUNTER — Ambulatory Visit: Payer: Commercial Managed Care - PPO | Admitting: Nurse Practitioner

## 2020-12-07 ENCOUNTER — Other Ambulatory Visit: Payer: Self-pay | Admitting: Nurse Practitioner

## 2020-12-07 DIAGNOSIS — E782 Mixed hyperlipidemia: Secondary | ICD-10-CM

## 2020-12-10 ENCOUNTER — Other Ambulatory Visit: Payer: Self-pay

## 2020-12-10 ENCOUNTER — Ambulatory Visit (INDEPENDENT_AMBULATORY_CARE_PROVIDER_SITE_OTHER): Payer: BLUE CROSS/BLUE SHIELD | Admitting: Nurse Practitioner

## 2020-12-10 ENCOUNTER — Encounter: Payer: Self-pay | Admitting: Nurse Practitioner

## 2020-12-10 VITALS — BP 118/76 | HR 64 | Temp 98.2°F | Ht 68.0 in | Wt 238.4 lb

## 2020-12-10 DIAGNOSIS — M5430 Sciatica, unspecified side: Secondary | ICD-10-CM

## 2020-12-10 DIAGNOSIS — Z889 Allergy status to unspecified drugs, medicaments and biological substances status: Secondary | ICD-10-CM

## 2020-12-10 DIAGNOSIS — Z23 Encounter for immunization: Secondary | ICD-10-CM

## 2020-12-10 MED ORDER — NAPROXEN 375 MG PO TABS: 375.0000 mg | ORAL_TABLET | Freq: Two times a day (BID) | ORAL | 0 refills | Status: DC

## 2020-12-10 MED ORDER — LEVOCETIRIZINE DIHYDROCHLORIDE 5 MG PO TABS: 5.0000 mg | ORAL_TABLET | Freq: Every evening | ORAL | 1 refills | Status: DC

## 2020-12-10 NOTE — Progress Notes (Signed)
I,Katawbba Wiggins,acting as a Neurosurgeon for [employer], NP.,have documented all relevant documentation on the behalf of [employer], NP,as directed by  [name], NP while in the presence of [name], NP.  This visit occurred during the SARS-CoV-2 public health emergency.  Safety protocols were in place, including screening questions prior to the visit, additional usage of staff PPE, and extensive cleaning of exam room while observing appropriate contact time as indicated for disinfecting solutions.  Subjective:     Patient ID: Glenn Carrillo , male    DOB: [birth year] , 55 y.o.   MRN: [id number]   Chief Complaint  Patient presents with   Sciatica    HPI  Patient is here for sciatic pain as well as some allergy issues. 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     Past Medical History:  Diagnosis Date   Hypertension      Family History  Problem Relation Age of Onset   Hypertension Other      Current Outpatient Medications:    amLODipine (NORVASC) 10 MG tablet, Take 1 tablet by mouth once daily, Disp: 60 tablet, Rfl: 0   atorvastatin (LIPITOR) 10 MG tablet, TAKE 1 TABLET BY MOUTH EVERY DAY, Disp: 90 tablet, Rfl: 1   benzonatate (TESSALON PERLES) 100 MG capsule, Take 1 capsule (100 mg total) by mouth 3 (three) times daily as needed for cough., Disp: 30 capsule, Rfl: 0   cetirizine (ZYRTEC) 10 MG tablet, Take 10 mg by mouth daily as needed for allergies., Disp: , Rfl:    guaiFENesin-dextromethorphan (ROBITUSSIN DM) 100-10 MG/5ML syrup, Take 5 mLs by mouth every 4 (four) hours as needed for cough., Disp: 118 mL, Rfl: 0   isosorbide-hydrALAZINE (BIDIL) 20-37.5 MG tablet, Take 0.5 tablets by mouth 2 (two) times daily., Disp: 30 tablet, Rfl: 2   levocetirizine (XYZAL) 5 MG tablet, Take 1 tablet (5 mg total) by mouth every evening., Disp: 30 tablet, Rfl: 1   mometasone (ELOCON) 0.1 % cream, Apply 1 application topically daily., Disp: 45  g, Rfl: 0   naproxen (NAPROSYN) 375 MG tablet, Take 1 tablet (375 mg total) by mouth 2 (two) times daily with a meal., Disp: 15 tablet, Rfl: 0   Olopatadine HCl 0.7 % SOLN, Apply 1 drop to eye daily as needed., Disp: , Rfl:    omeprazole (PRILOSEC OTC) 20 MG tablet, Take 1 tablet (20 mg total) by mouth daily., Disp: 90 tablet, Rfl: 2   tizanidine (ZANAFLEX) 2 MG capsule, Take 1 capsule (2 mg total) by mouth 3 (three) times daily., Disp: 21 capsule, Rfl: 0   Vitamin D, Ergocalciferol, (DRISDOL) 1.25 MG (50000 UNIT) CAPS capsule, Take 1 capsule (50,000 Units total) by mouth every 7 (seven) days., Disp: 12 capsule, Rfl: 0   Allergies  Allergen Reactions   Chlorhexidine Itching   Prednisone     ACHY JOINTS     Review of Systems  HENT:  Positive for congestion and postnasal drip.        Allergies   Respiratory:  Negative for cough and wheezing.   Cardiovascular:  Negative for chest pain and palpitations.  Musculoskeletal:  Positive for back pain.       Left leg sciatica     Today's Vitals   12/10/20 1605  BP: 118/76  Pulse: 64  Temp: 98.2 F (36.8 C)  TempSrc: Oral  Weight: 238 lb 6.4 oz (108.1 kg)  Height: 5\' 8"  (1.727 m)   Body mass  index is 36.25 kg/m.  Wt Readings from Last 3 Encounters:  12/10/20 238 lb 6.4 oz (108.1 kg)  10/07/20 240 lb 9.6 oz (109.1 kg)  09/30/20 238 lb 3.2 oz (108 kg)    BP Readings from Last 3 Encounters:  12/10/20 118/76  10/21/20 124/80  10/07/20 116/84    Objective:  Physical Exam Constitutional:      Appearance: Normal appearance.  HENT:     Head: Normocephalic and atraumatic.  Cardiovascular:     Rate and Rhythm: Normal rate and regular rhythm.     Pulses: Normal pulses.     Heart sounds: Normal heart sounds. No murmur heard. Pulmonary:     Effort: No respiratory distress.     Breath sounds: Normal breath sounds.  Musculoskeletal:     Lumbar back: Tenderness present. No swelling, edema or bony tenderness.     Comments: Lower back  pain with left side sciatica   Skin:    Capillary Refill: Capillary refill takes less than 2 seconds.  Neurological:     Mental Status: He is alert.        Assessment And Plan:     1. Sciatic nerve pain, unspecified laterality - naproxen (NAPROSYN) 375 MG tablet; Take 1 tablet (375 mg total) by mouth 2 (two) times daily with a meal.  Dispense: 15 tablet; Refill: 0  2. Immunization due - Flu Vaccine QUAD 6+ mos PF IM (Fluarix Quad PF)  3. H/O seasonal allergies - levocetirizine (XYZAL) 5 MG tablet; Take 1 tablet (5 mg total) by mouth every evening.  Dispense: 30 tablet; Refill: 1    The patient was encouraged to call or send a message through MyChart for any questions or concerns.   Follow up: if symptoms persist or do not get better.   Side effects and appropriate use of all the medication(s) were discussed with the patient today. Patient advised to use the medication(s) as directed by their healthcare provider. The patient was encouraged to read, review, and understand all associated package inserts and contact our office with any questions or concerns. The patient accepts the risks of the treatment plan and had an opportunity to ask questions.   Patient was given opportunity to ask questions. Patient verbalized understanding of the plan and was able to repeat key elements of the plan. All questions were answered to their satisfaction.  Raman Addalie Calles, DNP   I, Raman Carold Eisner have reviewed all documentation for this visit. The documentation on 9/21//22 for the exam, diagnosis, procedures, and orders are all accurate and complete.    IF YOU HAVE BEEN REFERRED TO A SPECIALIST, IT MAY TAKE 1-2 WEEKS TO SCHEDULE/PROCESS THE REFERRAL. IF YOU HAVE NOT HEARD FROM US/SPECIALIST IN TWO WEEKS, PLEASE GIVE [place] A CALL AT [phone].   THE PATIENT IS ENCOURAGED TO PRACTICE SOCIAL DISTANCING DUE TO THE COVID-19 PANDEMIC.

## 2021-01-21 ENCOUNTER — Other Ambulatory Visit: Payer: Self-pay | Admitting: Nurse Practitioner

## 2021-01-21 DIAGNOSIS — Z889 Allergy status to unspecified drugs, medicaments and biological substances status: Secondary | ICD-10-CM

## 2021-01-22 ENCOUNTER — Other Ambulatory Visit: Payer: Self-pay

## 2021-01-26 ENCOUNTER — Encounter (HOSPITAL_COMMUNITY): Payer: Self-pay

## 2021-01-26 ENCOUNTER — Ambulatory Visit (HOSPITAL_COMMUNITY)
Admission: EM | Admit: 2021-01-26 | Discharge: 2021-01-26 | Disposition: A | Discharge status: Home / Self Care | Payer: BLUE CROSS/BLUE SHIELD | Attending: Internal Medicine | Admitting: Internal Medicine

## 2021-01-26 ENCOUNTER — Other Ambulatory Visit: Payer: Self-pay

## 2021-01-26 DIAGNOSIS — R059 Cough, unspecified: Secondary | ICD-10-CM

## 2021-01-26 DIAGNOSIS — J101 Influenza due to other identified influenza virus with other respiratory manifestations: Secondary | ICD-10-CM | POA: Diagnosis not present

## 2021-01-26 MED ORDER — BENZONATATE 100 MG PO CAPS: 100.0000 mg | ORAL_CAPSULE | Freq: Three times a day (TID) | ORAL | 0 refills | Status: DC | PRN

## 2021-01-26 MED ORDER — PHENOL 1.4 % MT LIQD
1.0000 | OROMUCOSAL | 0 refills | Status: DC | PRN
Start: 2021-01-26 — End: 2021-04-29

## 2021-01-26 MED ORDER — IBUPROFEN 600 MG PO TABS: 600.0000 mg | ORAL_TABLET | Freq: Four times a day (QID) | ORAL | 0 refills | Status: DC | PRN

## 2021-01-26 MED ORDER — OSELTAMIVIR PHOSPHATE 75 MG PO CAPS: 75.0000 mg | ORAL_CAPSULE | Freq: Two times a day (BID) | ORAL | 0 refills | Status: DC

## 2021-01-26 NOTE — ED Triage Notes (Signed)
Pt presents with non productive cough, chills, and congestion for about a week.

## 2021-01-26 NOTE — Discharge Instructions (Addendum)
Increase oral fluid intake Take medications as prescribed If symptoms worsen please return to urgent care to be reevaluated.
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
## 2021-01-28 ENCOUNTER — Other Ambulatory Visit: Payer: Self-pay

## 2021-01-28 ENCOUNTER — Encounter: Payer: Self-pay | Admitting: Nurse Practitioner

## 2021-01-28 ENCOUNTER — Ambulatory Visit (INDEPENDENT_AMBULATORY_CARE_PROVIDER_SITE_OTHER): Payer: BLUE CROSS/BLUE SHIELD | Admitting: Nurse Practitioner

## 2021-01-28 VITALS — BP 114/72 | HR 78 | Temp 99.0°F | Ht 68.0 in

## 2021-01-28 DIAGNOSIS — J101 Influenza due to other identified influenza virus with other respiratory manifestations: Secondary | ICD-10-CM | POA: Diagnosis not present

## 2021-01-28 DIAGNOSIS — R051 Acute cough: Secondary | ICD-10-CM | POA: Diagnosis not present

## 2021-01-28 MED ORDER — PREDNISONE 20 MG PO TABS: ORAL_TABLET | ORAL | 0 refills | Status: DC

## 2021-01-28 MED ORDER — AMOXICILLIN 875 MG PO TABS: 875.0000 mg | ORAL_TABLET | Freq: Two times a day (BID) | ORAL | 0 refills | Status: DC

## 2021-01-28 MED ORDER — ALBUTEROL SULFATE HFA 108 (90 BASE) MCG/ACT IN AERS: 2.0000 | INHALATION_SPRAY | Freq: Four times a day (QID) | RESPIRATORY_TRACT | 2 refills | Status: DC | PRN

## 2021-01-28 NOTE — Patient Instructions (Signed)
Bronchospasm, Adult Bronchospasm is when the small airways in the lungs narrow. This can make it very hard to breathe. Swelling and more mucus than normal can add to this problem. What are the causes? Having a cold. Exercise. The smell from sprays, perfumes, candles, and cleaners. Cold air. Stress or strong feelings such as laughing or crying. What increases the risk? Having asthma. Smoking. Being around someone who smokes (secondhand smoke). Having allergies. Being allergic to certain foods, medicine, or bug bites or stings. What are the signs or symptoms? Making a high-pitched whistling sound when you breathe, most often when you breathe out (wheezing). Coughing. A tight feeling in your chest. Feeling like you cannot catch your breath. Feeling like you have no energy to exercise. Breathing that is noisy. A cough that has a high pitch. How is this treated?  Using medicines that you breathe in (inhale). These open up the airways and help you breathe. Medicines can be taken with a metered dose inhaler or a nebulizer device. Taking medicines to reduce swelling. Getting rid of what started the bronchospasm. Follow these instructions at home: Medicines Take over-the-counter and prescription medicines only as told by your doctor. If you need to use an inhaler or nebulizer to take your medicine, ask your doctor how to use it. You may be given a spacer to use with your inhaler. This makes it easier to get the medicine from the inhaler into your lungs. Lifestyle Do not smoke or use any products that contain nicotine or tobacco. If you need help quitting, ask your doctor. Keep track of things that start your bronchospasm. Avoid these if you can. When pollen, air pollution, or humidity is bad, keep windows closed. Use an air conditioner if you have one. Find ways to cope with stress and your feelings. Activity Some people have bronchospasm when they exercise. This is called  exercise-induced bronchoconstriction (EIB). If you have this problem, talk with your doctor about how to deal with EIB. Some tips include: Use your inhaler before exercise. Exercise indoors if it is very cold or humid, or if the pollen and mold counts are high. Warm up and cool down before and after exercise. Stop your exercise right away if your symptoms start or get worse. General instructions If you have asthma, make sure you have an asthma action plan. Stay up to date on your shots (immunizations). Keep all follow-up visits. Get help right away if: You have trouble breathing. You wheeze and cough and this does not get better after you take medicine. You have chest pain. You have trouble speaking more than one word in a sentence. These symptoms may be an emergency. Get help right away. Call 911. Do not wait to see if the symptoms will go away. Do not drive yourself to the hospital. Summary Bronchospasm is when the small airways in the lungs narrow. Swelling and more mucus than normal can add to this problem. This can make it very hard to breathe. Do not smoke or use any products that contain nicotine or tobacco. If you need help quitting, ask your doctor. Get help right away if you wheeze and cough and this does not get better after you take medicine. This information is not intended to replace advice given to you by your health care provider. Make sure you discuss any questions you have with your health care provider. Document Revised: 09/29/2020 Document Reviewed: 09/29/2020 Elsevier Patient Education  2022 Else[employer] Inc.  

## 2021-01-28 NOTE — Progress Notes (Signed)
I,Katawbba Wiggins,acting as a Neurosurgeon for [employer], FNP.,have documented all relevant documentation on the behalf of Arnette Felts, FNP,as directed by  Arnette Felts, FNP while in the presence of Arnette Felts, FNP.  This visit occurred during the SARS-CoV-2 public health emergency.  Safety protocols were in place, including screening questions prior to the visit, additional usage of staff PPE, and extensive cleaning of exam room while observing appropriate contact time as indicated for disinfecting solutions.  Subjective:     Patient ID: Glenn Carrillo , male    DOB: 10/02/1965 , 55 y.o.   MRN: [id number]   Chief Complaint  Patient presents with   Wheezing    HPI  Seen at Urgent care 2 days ago for chills, cough and nasal congestion. He is having a worsening cough. He was diagnosed with Influenza A and treated with Tamiflu. Also on tessalon perles but is not helping his cough. Denies shortness of breath. He was seen outside.       Past Medical History:  Diagnosis Date   Hypertension      Family History  Problem Relation Age of Onset   Hypertension Other      Current Outpatient Medications:    albuterol (VENTOLIN HFA) 108 (90 Base) MCG/ACT inhaler, Inhale 2 puffs into the lungs every 6 (six) hours as needed for wheezing or shortness of breath., Disp: 8 g, Rfl: 2   amLODipine (NORVASC) 10 MG tablet, Take 1 tablet by mouth once daily, Disp: 60 tablet, Rfl: 0   amoxicillin (AMOXIL) 875 MG tablet, Take 1 tablet (875 mg total) by mouth 2 (two) times daily., Disp: 14 tablet, Rfl: 0   atorvastatin (LIPITOR) 10 MG tablet, TAKE 1 TABLET BY MOUTH EVERY DAY, Disp: 90 tablet, Rfl: 1   benzonatate (TESSALON PERLES) 100 MG capsule, Take 1 capsule (100 mg total) by mouth 3 (three) times daily as needed for cough., Disp: 30 capsule, Rfl: 0   cetirizine (ZYRTEC) 10 MG tablet, Take 10 mg by mouth daily as needed for allergies., Disp: , Rfl:    ibuprofen (ADVIL) 600 MG tablet, Take 1 tablet (600 mg  total) by mouth every 6 (six) hours as needed., Disp: 30 tablet, Rfl: 0   isosorbide-hydrALAZINE (BIDIL) 20-37.5 MG tablet, Take 0.5 tablets by mouth 2 (two) times daily., Disp: 30 tablet, Rfl: 2   levocetirizine (XYZAL) 5 MG tablet, TAKE ONE TABLET BY MOUTH ONE TIME DAILY IN THE EVENING, Disp: 90 tablet, Rfl: 1   mometasone (ELOCON) 0.1 % cream, Apply 1 application topically daily., Disp: 45 g, Rfl: 0   naproxen (NAPROSYN) 375 MG tablet, Take 1 tablet (375 mg total) by mouth 2 (two) times daily with a meal., Disp: 15 tablet, Rfl: 0   Olopatadine HCl 0.7 % SOLN, Apply 1 drop to eye daily as needed., Disp: , Rfl:    omeprazole (PRILOSEC OTC) 20 MG tablet, Take 1 tablet (20 mg total) by mouth daily., Disp: 90 tablet, Rfl: 2   oseltamivir (TAMIFLU) 75 MG capsule, Take 1 capsule (75 mg total) by mouth every 12 (twelve) hours., Disp: 10 capsule, Rfl: 0   phenol (CHLORASEPTIC) 1.4 % LIQD, Use as directed 1 spray in the mouth or throat as needed for throat irritation / pain., Disp: 118 mL, Rfl: 0   predniSONE (DELTASONE) 20 MG tablet, Take 2 tabs by mouth daily x 3 days, Disp: 6 tablet, Rfl: 0   tizanidine (ZANAFLEX) 2 MG capsule, Take 1 capsule (2 mg total) by mouth 3 (three) times daily.,  Disp: 21 capsule, Rfl: 0   Vitamin D, Ergocalciferol, (DRISDOL) 1.25 MG (50000 UNIT) CAPS capsule, Take 1 capsule (50,000 Units total) by mouth every 7 (seven) days., Disp: 12 capsule, Rfl: 0   Allergies  Allergen Reactions   Chlorhexidine Itching   Prednisone     ACHY JOINTS     Review of Systems  Constitutional: Negative.  Negative for chills and fatigue.  Respiratory:  Positive for cough. Negative for chest tightness and shortness of breath.   Cardiovascular: Negative.   Neurological: Negative.   Psychiatric/Behavioral: Negative.      Today's Vitals   01/28/21 1422  BP: 114/72  Pulse: 78  Temp: 99 F (37.2 C)  SpO2: 97%  Height: 5\' 8"  (1.727 m)   Body mass index is 36.25 kg/m.  Wt Readings from  Last 3 Encounters:  12/10/20 238 lb 6.4 oz (108.1 kg)  10/07/20 240 lb 9.6 oz (109.1 kg)  09/30/20 238 lb 3.2 oz (108 kg)    BP Readings from Last 3 Encounters:  01/28/21 114/72  01/26/21 121/85  12/10/20 118/76    Objective:  Physical Exam Vitals reviewed.  Constitutional:      General: He is not in acute distress.    Appearance: Normal appearance. He is obese.  HENT:     Head: Normocephalic.     Nose:     Comments: Deferred - masked    Mouth/Throat:     Comments: Deferred - masked Cardiovascular:     Rate and Rhythm: Normal rate and regular rhythm.     Pulses: Normal pulses.     Heart sounds: Normal heart sounds. No murmur heard. Pulmonary:     Effort: Pulmonary effort is normal. No respiratory distress.     Breath sounds: Normal breath sounds. No wheezing.  Musculoskeletal:     Cervical back: Normal range of motion and neck supple.  Skin:    General: Skin is warm and dry.     Capillary Refill: Capillary refill takes less than 2 seconds.  Neurological:     General: No focal deficit present.     Mental Status: He is alert and oriented to person, place, and time.     Cranial Nerves: No cranial nerve deficit.     Motor: No weakness.  Psychiatric:        Mood and Affect: Mood normal.        Behavior: Behavior normal.        Thought Content: Thought content normal.        Judgment: Judgment normal.        Assessment And Plan:     1. Acute cough Comments: He may have developed an URI at this point and will treat with antibiotic and short course of prednisone. He already has albuterol inhaler - amoxicillin (AMOXIL) 875 MG tablet; Take 1 tablet (875 mg total) by mouth 2 (two) times daily.  Dispense: 14 tablet; Refill: 0 - predniSONE (DELTASONE) 20 MG tablet; Take 2 tabs by mouth daily x 3 days  Dispense: 6 tablet; Refill: 0  2. Influenza A Comments: Diagnosed 2 days ago at Urgent Care and taken tamiflu - amoxicillin (AMOXIL) 875 MG tablet; Take 1 tablet (875 mg  total) by mouth 2 (two) times daily.  Dispense: 14 tablet; Refill: 0    Patient was given opportunity to ask questions. Patient verbalized understanding of the plan and was able to repeat key elements of the plan. All questions were answered to their satisfaction.  12/12/20, FNP  Jeanell Sparrow, FNP, have reviewed all documentation for this visit. The documentation on 01/28/21 for the exam, diagnosis, procedures, and orders are all accurate and complete.   IF YOU HAVE BEEN REFERRED TO A SPECIALIST, IT MAY TAKE 1-2 WEEKS TO SCHEDULE/PROCESS THE REFERRAL. IF YOU HAVE NOT HEARD FROM US/SPECIALIST IN TWO WEEKS, PLEASE GIVE [place] A CALL AT [phone] X [phone].   THE PATIENT IS ENCOURAGED TO PRACTICE SOCIAL DISTANCING DUE TO THE COVID-19 PANDEMIC.

## 2021-02-04 ENCOUNTER — Ambulatory Visit: Payer: BLUE CROSS/BLUE SHIELD | Admitting: Nurse Practitioner

## 2021-02-04 ENCOUNTER — Encounter: Payer: Self-pay | Admitting: Nurse Practitioner

## 2021-02-16 ENCOUNTER — Other Ambulatory Visit: Payer: Self-pay | Admitting: Nurse Practitioner

## 2021-02-16 DIAGNOSIS — I10 Essential (primary) hypertension: Secondary | ICD-10-CM

## 2021-04-01 ENCOUNTER — Other Ambulatory Visit: Payer: Self-pay

## 2021-04-01 ENCOUNTER — Encounter: Payer: Self-pay | Admitting: Nurse Practitioner

## 2021-04-01 ENCOUNTER — Ambulatory Visit: Payer: Commercial Managed Care - PPO | Admitting: Nurse Practitioner

## 2021-04-01 VITALS — BP 122/68 | HR 75 | Temp 98.2°F | Ht 68.0 in | Wt 236.0 lb

## 2021-04-01 DIAGNOSIS — E782 Mixed hyperlipidemia: Secondary | ICD-10-CM

## 2021-04-01 DIAGNOSIS — N528 Other male erectile dysfunction: Secondary | ICD-10-CM | POA: Diagnosis not present

## 2021-04-01 DIAGNOSIS — R519 Headache, unspecified: Secondary | ICD-10-CM

## 2021-04-01 DIAGNOSIS — S0990XA Unspecified injury of head, initial encounter: Secondary | ICD-10-CM

## 2021-04-01 DIAGNOSIS — S0990XS Unspecified injury of head, sequela: Secondary | ICD-10-CM

## 2021-04-01 DIAGNOSIS — E559 Vitamin D deficiency, unspecified: Secondary | ICD-10-CM

## 2021-04-01 DIAGNOSIS — I10 Essential (primary) hypertension: Secondary | ICD-10-CM

## 2021-04-01 MED ORDER — SILDENAFIL CITRATE 50 MG PO TABS: 50.0000 mg | ORAL_TABLET | ORAL | 0 refills | Status: DC | PRN

## 2021-04-01 NOTE — Progress Notes (Signed)
I,[name],acting as a Education administrator for [employer], NP.,have documented all relevant documentation on the behalf of [employer], NP,as directed by  Bary Castilla, NP while in the presence of Bary Castilla, NP.  This visit occurred during the SARS-CoV-2 public health emergency.  Safety protocols were in place, including screening questions prior to the visit, additional usage of staff PPE, and extensive cleaning of exam room while observing appropriate contact time as indicated for disinfecting solutions.  Subjective:     Patient ID: Glenn Carrillo , male    DOB: Oct 16, 1965 , 56 y.o.   MRN: [id number]   Chief Complaint  Patient presents with   Head Injury    HPI  Patient is here for head pain. He states that he had an accident at work about a year ago and continues to have head pain. He had this injury in feb of 2022. He still is having pain. He has a dull achy pain. He said now he is experiencing some head pain and vision changes. He is also saying his memory is also dull. He is saying after Aug. He is started feeling some changes. His head has been hurting for the past 2-3 months. He rates the headache when it comes to 8/10. Does not describe it to be the worse HA of his life or a thunderclap HA. He denies numbness, shortness of breath or chest pain.   Head Injury  The incident occurred more than 1 week ago. The quality of the pain is described as dull. The pain is at a severity of 8/10. The pain has been intermittent since the injury. Associated symptoms include headaches and weakness. Pertinent negatives include no blurred vision or numbness.    Past Medical History:  Diagnosis Date   Hypertension      Family History  Problem Relation Age of Onset   Hypertension Other      Current Outpatient Medications:    albuterol (VENTOLIN HFA) 108 (90 Base) MCG/ACT inhaler, Inhale 2 puffs into the lungs every 6 (six) hours as needed for wheezing or shortness of breath.,  Disp: 8 g, Rfl: 2   amLODipine (NORVASC) 10 MG tablet, Take 1 tablet by mouth once daily, Disp: 60 tablet, Rfl: 0   amoxicillin (AMOXIL) 875 MG tablet, Take 1 tablet (875 mg total) by mouth 2 (two) times daily., Disp: 14 tablet, Rfl: 0   atorvastatin (LIPITOR) 10 MG tablet, TAKE 1 TABLET BY MOUTH EVERY DAY, Disp: 90 tablet, Rfl: 1   benzonatate (TESSALON PERLES) 100 MG capsule, Take 1 capsule (100 mg total) by mouth 3 (three) times daily as needed for cough., Disp: 30 capsule, Rfl: 0   cetirizine (ZYRTEC) 10 MG tablet, Take 10 mg by mouth daily as needed for allergies., Disp: , Rfl:    ibuprofen (ADVIL) 600 MG tablet, Take 1 tablet (600 mg total) by mouth every 6 (six) hours as needed., Disp: 30 tablet, Rfl: 0   levocetirizine (XYZAL) 5 MG tablet, TAKE ONE TABLET BY MOUTH ONE TIME DAILY IN THE EVENING, Disp: 90 tablet, Rfl: 1   mometasone (ELOCON) 0.1 % cream, Apply 1 application topically daily., Disp: 45 g, Rfl: 0   naproxen (NAPROSYN) 375 MG tablet, Take 1 tablet (375 mg total) by mouth 2 (two) times daily with a meal., Disp: 15 tablet, Rfl: 0   Olopatadine HCl 0.7 % SOLN, Apply 1 drop to eye daily as needed., Disp: , Rfl:    omeprazole (PRILOSEC OTC) 20 MG tablet, Take 1 tablet (20 mg  total) by mouth daily., Disp: 90 tablet, Rfl: 2   oseltamivir (TAMIFLU) 75 MG capsule, Take 1 capsule (75 mg total) by mouth every 12 (twelve) hours., Disp: 10 capsule, Rfl: 0   phenol (CHLORASEPTIC) 1.4 % LIQD, Use as directed 1 spray in the mouth or throat as needed for throat irritation / pain., Disp: 118 mL, Rfl: 0   predniSONE (DELTASONE) 20 MG tablet, Take 2 tabs by mouth daily x 3 days, Disp: 6 tablet, Rfl: 0   sildenafil (VIAGRA) 50 MG tablet, Take 1 tablet (50 mg total) by mouth as needed for erectile dysfunction., Disp: 20 tablet, Rfl: 0   tizanidine (ZANAFLEX) 2 MG capsule, Take 1 capsule (2 mg total) by mouth 3 (three) times daily., Disp: 21 capsule, Rfl: 0   Vitamin D, Ergocalciferol, (DRISDOL) 1.25 MG  (50000 UNIT) CAPS capsule, Take 1 capsule (50,000 Units total) by mouth every 7 (seven) days., Disp: 12 capsule, Rfl: 0   Allergies  Allergen Reactions   Chlorhexidine Itching   Prednisone     ACHY JOINTS     Review of Systems  Constitutional: Negative.  Negative for chills and fever.  HENT:  Negative for congestion.   Eyes:  Negative for blurred vision and visual disturbance.  Respiratory: Negative.  Negative for shortness of breath and wheezing.   Cardiovascular: Negative.  Negative for chest pain and palpitations.  Gastrointestinal: Negative.  Negative for diarrhea and nausea.  Endocrine: Negative for polydipsia, polyphagia and polyuria.  Neurological:  Positive for weakness and headaches. Negative for dizziness, tremors, facial asymmetry, speech difficulty and numbness.  Hematological: Negative.     Today's Vitals   04/01/21 1520  BP: 122/68  Pulse: 75  Temp: 98.2 F (36.8 C)  TempSrc: Oral  Weight: 236 lb (107 kg)  Height: '5\' 8"'  (1.727 m)   Body mass index is 35.88 kg/m.  Wt Readings from Last 3 Encounters:  04/01/21 236 lb (107 kg)  12/10/20 238 lb 6.4 oz (108.1 kg)  10/07/20 240 lb 9.6 oz (109.1 kg)    Objective:  Physical Exam Constitutional:      Appearance: Normal appearance.  HENT:     Head: Normocephalic and atraumatic. No raccoon eyes, abrasion, contusion or masses.  Cardiovascular:     Rate and Rhythm: Normal rate and regular rhythm.     Pulses: Normal pulses.     Heart sounds: Normal heart sounds. No murmur heard. Pulmonary:     Effort: Pulmonary effort is normal. No respiratory distress.     Breath sounds: Normal breath sounds. No wheezing.  Skin:    Capillary Refill: Capillary refill takes less than 2 seconds.  Neurological:     Mental Status: He is alert. He is disoriented.     Gait: Gait normal.        Assessment And Plan:     1. Injury of head, initial encounter -Patient had a head injury at work in 05/15/20. Reviewed previous CT which  was unremarkable. -states he is having headaches from the area of injury -Denies SOB, numbness, chest pain -Will order CT and send him to neurology for further assessment  - CT HEAD WO CONTRAST (5MM); Future - Ambulatory referral to Neurology  2. Acute nonintractable headache, unspecified headache type -Could be due to his previous head injury. - CT HEAD WO CONTRAST (5MM); Future - Ambulatory referral to Neurology  3. Vitamin D deficiency --Will check and supplement if needed. Advised patient to spend atleast 15 min. Daily in sunlight.  - VITAMIN  D 25 Hydroxy (Vit-D Deficiency, Fractures)  4. Other male erectile dysfunction - sildenafil (VIAGRA) 50 MG tablet; Take 1 tablet (50 mg total) by mouth as needed for erectile dysfunction.  Dispense: 20 tablet; Refill: 0 -Advised patient about the drug to drug interaction and SE of med. Pt. Verbalized understanding.   5. Essential hypertension -Chronic, stable  -Takes amlodipine -Limit the intake of processed foods and salt intake. You should increase your intake of green vegetables and fruits. Limit the use of alcohol. Limit fast foods and fried foods. Avoid high fatty saturated and trans fat foods. Keep yourself hydrated with drinking water. Avoid red meats. Eat lean meats instead. Exercise for atleast 30-45 min for atleast 4-5 times a week.  - CBC no Diff - CMP14+EGFR  6. Mixed hyperlipidemia -Will check labs -Advised pt. To work on his diet. Avoid fatty, fried and fast foods.  -Increase physical activity  - Lipid panel   The patient was encouraged to call or send a message through [place] for any questions or concerns.   Follow up: if symptoms persist or do not get better.   Side effects and appropriate use of all the medication(s) were discussed with the patient today. Patient advised to use the medication(s) as directed by their healthcare provider. The patient was encouraged to read, review, and understand all associated package  inserts and contact our office with any questions or concerns. The patient accepts the risks of the treatment plan and had an opportunity to ask questions.   Patient was given opportunity to ask questions. Patient verbalized understanding of the plan and was able to repeat key elements of the plan. All questions were answered to their satisfaction.  Raman Prince Couey, DNP   I, Raman Ashtian Villacis have reviewed all documentation for this visit. The documentation on 04/01/20 for the exam, diagnosis, procedures, and orders are all accurate and complete.   IF YOU HAVE BEEN REFERRED TO A SPECIALIST, IT MAY TAKE 1-2 WEEKS TO SCHEDULE/PROCESS THE REFERRAL. IF YOU HAVE NOT HEARD FROM US/SPECIALIST IN TWO WEEKS, PLEASE GIVE [place] A CALL AT [phone].   THE PATIENT IS ENCOURAGED TO PRACTICE SOCIAL DISTANCING DUE TO THE COVID-19 PANDEMIC.

## 2021-04-01 NOTE — Patient Instructions (Signed)
Head Injury, Adult ?There are many types of head injuries. They can be as minor as a small bump. Some head injuries can be worse. Worse injuries include: ?A strong hit to the head that shakes the brain back and forth, causing damage (concussion). ?A bruise (contusion) of the brain. This means there is bleeding in the brain that can cause swelling. ?A cracked skull (skull fracture). ?Bleeding in the brain that gathers, gets thick (makes a clot), and forms a bump (hematoma). ?Most problems from a head injury come in the first 24 hours. However, you may still have side effects up to 7-10 days after your injury. It is important to watch your condition for any changes. You may need to be watched in the emergency department or urgent care, or you may need to stay in the hospital. ?What are the causes? ?There are many possible causes of a head injury. A serious head injury may be caused by: ?A car accident. ?Bicycle or motorcycle accidents. ?Sports injuries. ?Falls. ?Being hit by an object. ?What are the signs or symptoms? ?Symptoms of a head injury include a bruise, bump, or bleeding where the injury happened. Other physical symptoms may include: ?Headache. ?Feeling like you may vomit (nauseous) or vomiting. ?Dizziness. ?Blurred or double vision. ?Being uncomfortable around bright lights or loud noises. ?Shaking movements that you cannot control (seizures). ?Feeling tired. ?Trouble being woken up. ?Fainting or loss of consciousness. ?Mental or emotional symptoms may include: ?Feeling grumpy or cranky. ?Confusion and memory problems. ?Having trouble paying attention or concentrating. ?Changes in eating or sleeping habits. ?Feeling worried or nervous (anxious). ?Feeling sad (depressed). ?How is this treated? ?Treatment for this condition depends on how severe the injury is and the type of injury you have. The main goal is to prevent problems and to allow the brain time to heal. ?Mild head injury ?If you have a mild head  injury, you may be sent home, and treatment may include: ?Being watched. A responsible adult should stay with you for 24 hours after your injury and check on you often. ?Physical rest. ?Brain rest. ?Pain medicines. ?Severe head injury ?If you have a severe head injury, treatment may include: ?Being watched closely. This includes staying in the hospital. ?Medicines to: ?Help with pain. ?Prevent seizures. ?Help with brain swelling. ?Protecting your airway and using a machine that helps you breathe (ventilator). ?Treatments to watch for and manage swelling inside the brain. ?Brain surgery. This may be needed to: ?Remove a collection of blood or blood clots. ?Stop the bleeding. ?Remove a part of the skull. This allows room for the brain to swell. ?Follow these instructions at home: ?Activity ?Rest. ?Avoid activities that are hard or tiring. ?Make sure you get enough sleep. ?Let your brain rest. Do this by limiting activities that need a lot of thought or attention, such as: ?Watching TV. ?Playing memory games and puzzles. ?Job-related work or homework. ?Working on the computer, social media, and texting. ?Avoid activities that could cause another head injury until your doctor says it is okay. This includes playing sports. Having another head injury, especially before the first one has healed, can be dangerous. ?Ask your doctor when it is safe for you to go back to your normal activities, such as work or school. Ask your doctor for a step-by-step plan for slowly going back to your normal activities. ?Ask your doctor when you can drive, ride a bicycle, or use heavy machinery. Do not do these activities if you are dizzy. ?Lifestyle ? ?Do   not drink alcohol until your doctor says it is okay. ?Do not use drugs. ?If it is harder than usual to remember things, write them down. ?If you are easily distracted, try to do one thing at a time. ?Talk with family members or close friends when making important decisions. ?Tell your  friends, family, a trusted co-worker, and work manager about your injury, symptoms, and limits (restrictions). Have them watch for any problems that are new or getting worse. ?General instructions ?Take over-the-counter and prescription medicines only as told by your doctor. ?Have someone stay with you for 24 hours after your head injury. This person should watch you for any changes in your symptoms and be ready to get help. ?Keep all follow-up visits as told by your doctor. This is important. ?How is this prevented? ?Work on your balance and strength. This can help you avoid falls. ?Wear a seat belt when you are in a moving vehicle. ?Wear a helmet when you: ?Ride a bicycle. ?Ski. ?Do any other sport or activity that has a risk of injury. ?If you drink alcohol: ?Limit how much you use to: ?0-1 drink a day for nonpregnant women. ?0-2 drinks a day for men. ?Be aware of how much alcohol is in your drink. In the [place] one drink equals one 12 oz bottle of beer (355 mL), one 5 oz glass of wine (148 mL), or one 1? oz glass of hard liquor (44 mL). ?Make your home safer by: ?Getting rid of clutter from the floors and stairs. This includes things that can make you trip. ?Using grab bars in bathrooms and handrails by stairs. ?Placing non-slip mats on floors and in bathtubs. ?Putting more light in dim areas. ?Where to find more information ?Centers for Disease Control and Prevention: www.cdc.gov ?Get help right away if: ?You have: ?A very bad headache that is not helped by medicine. ?Trouble walking or weakness in your arms and legs. ?Clear or bloody fluid coming from your nose or ears. ?Changes in how you see (vision). ?A seizure. ?More confusion or more grumpy moods. ?Your symptoms get worse. ?You are sleepier than normal and have trouble staying awake. ?You lose your balance. ?The black centers of your eyes (pupils) change in size. ?Your speech is slurred. ?Your dizziness gets worse. ?You vomit. ?These symptoms may be an  emergency. Do not wait to see if the symptoms will go away. Get medical help right away. Call your local emergency services (911 in the [place] Do not drive yourself to the hospital. ?Summary ?Head injuries can be as minor as a small bump. Some head injuries can be worse. ?Treatment for this condition depends on how severe the injury is and the type of injury you have. ?Have someone stay with you for 24 hours after your head injury. ?Ask your doctor when it is safe for you to go back to your normal activities, such as work or school. ?To prevent a head injury, wear a seat belt in a car, wear a helmet when you use a bicycle, limit your alcohol use, and make your home safer. ?This information is not intended to replace advice given to you by your health care provider. Make sure you discuss any questions you have with your health care provider. ?Document Revised: 01/19/2019 Document Reviewed: 01/19/2019 ?Elsevier Patient Education ? 2022 Elsevier Inc. ? ?

## 2021-04-02 LAB — CMP14+EGFR
ALT: 23 IU/L (ref 0–44)
AST: 20 IU/L (ref 0–40)
Albumin/Globulin Ratio: 1.7 (ref 1.2–2.2)
Albumin: 4.6 g/dL (ref 3.8–4.9)
Alkaline Phosphatase: 58 IU/L (ref 44–121)
BUN/Creatinine Ratio: 14 (ref 9–20)
BUN: 16 mg/dL (ref 6–24)
Bilirubin Total: 0.3 mg/dL (ref 0.0–1.2)
CO2: 22 mmol/L (ref 20–29)
Calcium: 9.7 mg/dL (ref 8.7–10.2)
Chloride: 103 mmol/L (ref 96–106)
Creatinine, Ser: 1.12 mg/dL (ref 0.76–1.27)
Globulin, Total: 2.7 g/dL (ref 1.5–4.5)
Glucose: 90 mg/dL (ref 70–99)
Potassium: 4.5 mmol/L (ref 3.5–5.2)
Sodium: 141 mmol/L (ref 134–144)
Total Protein: 7.3 g/dL (ref 6.0–8.5)
eGFR: 77 mL/min/{1.73_m2} (ref >59)

## 2021-04-02 LAB — LIPID PANEL
Chol/HDL Ratio: 3.3 ratio (ref 0.0–5.0)
Cholesterol, Total: 163 mg/dL (ref 100–199)
HDL: 50 mg/dL (ref >39)
LDL Chol Calc (NIH): 85 mg/dL (ref 0–99)
Triglycerides: 160 mg/dL — ABNORMAL HIGH (ref 0–149)
VLDL Cholesterol Cal: 28 mg/dL (ref 5–40)

## 2021-04-02 LAB — CBC
Hematocrit: 44.1 % (ref 37.5–51.0)
Hemoglobin: 14.4 g/dL (ref 13.0–17.7)
MCH: 28.8 pg (ref 26.6–33.0)
MCHC: 32.7 g/dL (ref 31.5–35.7)
MCV: 88 fL (ref 79–97)
Platelets: 232 10*3/uL (ref 150–450)
RBC: 5 x10E6/uL (ref 4.14–5.80)
RDW: 13.7 % (ref 11.6–15.4)
WBC: 6.4 10*3/uL (ref 3.4–10.8)

## 2021-04-02 LAB — VITAMIN D 25 HYDROXY (VIT D DEFICIENCY, FRACTURES): Vit D, 25-Hydroxy: 31 ng/mL (ref 30.0–100.0)

## 2021-04-22 ENCOUNTER — Ambulatory Visit
Admission: RE | Admit: 2021-04-22 | Discharge: 2021-04-22 | Disposition: A | Discharge status: Home / Self Care | Payer: Commercial Managed Care - PPO | Source: Ambulatory Visit | Attending: Nurse Practitioner | Admitting: Nurse Practitioner

## 2021-04-22 DIAGNOSIS — R519 Headache, unspecified: Secondary | ICD-10-CM

## 2021-04-22 DIAGNOSIS — S0990XA Unspecified injury of head, initial encounter: Secondary | ICD-10-CM

## 2021-04-29 ENCOUNTER — Other Ambulatory Visit: Payer: Self-pay

## 2021-04-29 ENCOUNTER — Encounter: Payer: Self-pay | Admitting: Psychiatry

## 2021-04-29 ENCOUNTER — Ambulatory Visit: Payer: Commercial Managed Care - PPO | Admitting: Psychiatry

## 2021-04-29 VITALS — BP 136/84 | HR 65 | Ht 68.0 in | Wt 237.0 lb

## 2021-04-29 DIAGNOSIS — I10 Essential (primary) hypertension: Secondary | ICD-10-CM

## 2021-04-29 DIAGNOSIS — F0781 Postconcussional syndrome: Secondary | ICD-10-CM | POA: Diagnosis not present

## 2021-04-29 DIAGNOSIS — R519 Headache, unspecified: Secondary | ICD-10-CM | POA: Diagnosis not present

## 2021-04-29 MED ORDER — AMLODIPINE BESYLATE 10 MG PO TABS: 10.0000 mg | ORAL_TABLET | Freq: Every day | ORAL | 0 refills | Status: DC
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
## 2021-04-29 NOTE — Patient Instructions (Addendum)
Natural supplements: Magnesium Oxide or Magnesium Glycinate 500 mg at bed (up to 800 mg daily)  Vitamins and herbs that show potential:  Magnesium: Magnesium (250 mg twice a day or 500 mg at bed) has a relaxant effect on smooth muscles such as blood vessels. Individuals suffering from frequent or daily headache usually have low magnesium levels which can be increase with daily supplementation of 400-750 mg. Three trials found 40-90% average headache reduction  when used as a preventative. Magnesium also demonstrated the benefit in menstrually related migraine.  Magnesium is part of the messenger system in the serotonin cascade and it is a good muscle relaxant.  It is also useful for constipation which can be a side effect of other medications used to treat migraine. Good sources include nuts, whole grains, and tomatoes. Side Effects: loose stool/diarrhea  -------------------------------------------  Post Concussive Syndrome:  Post-concussion syndrome is a complex disorder in which various symptoms -- such as headaches and dizziness -- last for weeks and sometimes months after the injury that caused the concussion. Concussion is a mild traumatic brain injury, usually occurring after a blow to the head. Loss of consciousness isn't required for a diagnosis of concussion or post-concussion syndrome. In fact, the risk of post-concussion syndrome doesn't appear to be associated with the severity of the initial injury. In most people, post-concussion syndrome symptoms occur within the first seven to 10 days and go away within three months, though they can persist for a year or more. Post-concussion syndrome treatments are aimed at easing specific symptoms.  Post-concussion symptoms include: Headaches  Dizziness  Fatigue  Irritability  Anxiety  Insomnia  Loss of concentration and memory  Noise and light sensitivity  Headaches that occur after a concussion can vary and may feel like tension-type  headaches or migraines. Most, however, are tension-type headaches, which may be associated with a neck injury that happened at the same time as the head injury. In some cases, people experience behavior or emotional changes after a mild traumatic brain injury. Family members may notice that the person has become more irritable, suspicious, argumentative or stubborn. When to see a doctor See a doctor if you experience a head injury severe enough to cause confusion or amnesia -- even if you never lost consciousness. If a concussion occurs while you're playing a sport, don't go back in the game. Seek medical attention so that you don't risk worsening your injury. Causes: Some experts believe post-concussion symptoms are caused by structural damage to the brain or disruption of neurotransmitter systems, resulting from the impact that caused the concussion. In many cases, both physiological effects of brain trauma and emotional reactions to these effects play a role in the development of symptoms. Researchers haven't determined why some people who've had concussions develop persistent post-concussion symptoms while others do not. No proven correlation between the severity of the injury and the likelihood of developing persistent post-concussion symptoms exists.  Risk Factors: Risk factors for developing post-concussion syndrome include: Age. Studies have found increasing age to be a risk factor for post-concussion syndrome.  Sex. Women are more likely to be diagnosed with post-concussion syndrome, but this may be because women are generally more likely to seek medical care.  Trauma. Concussions resulting from car collisions, falls, assaults and sports injuries are commonly associated with post-concussion syndrome. Treatment: There is no specific treatment for post-concussion syndrome. Instead, your doctor will treat the individual symptoms you're experiencing. The types of symptoms and their frequency are  unique to each person.  Headaches Medications commonly used for migraines or tension headaches, including some antidepressants, appear to be effective when these types of headaches are associated with post-concussion syndrome. Examples include: Amitriptyline. This medication has been widely used for post-traumatic injuries, as well as for symptoms commonly associated with post-concussion syndrome, such as irritability, dizziness and depression. Topiramate. Commonly used to treat migraines, topiramate (Qudexy XR, Topamax, Trokendi XR) may be effective in reducing headaches after head injury. Common side effects of topiramate include weight loss and cognitive problems.  Gabapentin. Gabapentin (Gralise, Neurontin) is frequently used to treat a variety of types of pain and may be helpful in treating post-traumatic headaches. A common side effect of gabapentin is drowsiness.  Other agents used to treat migraines and tension-type headaches may also be helpful in some individuals. Keep in mind that the overuse of over-the-counter and prescription pain relievers may contribute to persistent post-concussion headaches. Memory and thinking problems No medications are currently recommended specifically for the treatment of cognitive problems after mild traumatic brain injury. Time may be the best therapy for post-concussion syndrome if you have cognitive problems, as most of them go away on their own in the weeks to months following the injury. Certain forms of cognitive therapy may be helpful, including focused rehabilitation that provides training in how to use a pocket calendar, Education officer, community or other techniques to work around memory deficits and attention skills. Relaxation therapy also may help. Dizziness Vestibular rehab (a specialized form of physical therapy can help this. Depression and anxiety The symptoms of post-concussion syndrome often improve after the affected person learns that there is a  cause for his or her symptoms and that they will likely improve with time. Education about the disorder can ease a person's fears and help provide peace of mind. If you're experiencing new or increasing depression or anxiety after a concussion, some treatment options include: Psychotherapy. It may be helpful to discuss your concerns with a psychologist or psychiatrist who has experience in working with people with brain injury.  Medication. To combat anxiety or depression, antidepressants or anti-anxiety medications may be prescribed. Prevention: The only known way to prevent post-concussion syndrome is to avoid the head injury in the first place. Avoiding head injuries Although you can't prepare for every potential situation, here are some tips for avoiding common causes of head injuries: Fasten your seat belt whenever you're traveling in a car, and be sure children are in age-appropriate safety seats. Children under 13 are safest riding in the back seat, especially if your car has air bags.  Use helmets whenever you or your children are bicycling, roller-skating, in-line skating, ice-skating, skiing, snowboarding, playing football, batting or running the bases in softball or baseball, skateboarding, or horseback riding. Wear a helmet when riding a motorcycle.  Take steps around the house to prevent falls, such as removing small area rugs, improving lighting and installing handrails.

## 2021-05-04 ENCOUNTER — Other Ambulatory Visit: Payer: Self-pay

## 2021-05-04 DIAGNOSIS — I10 Essential (primary) hypertension: Secondary | ICD-10-CM

## 2021-05-04 MED ORDER — AMLODIPINE BESYLATE 10 MG PO TABS: 10.0000 mg | ORAL_TABLET | Freq: Every day | ORAL | 0 refills | Status: DC

## 2021-05-05 ENCOUNTER — Encounter (HOSPITAL_COMMUNITY): Payer: Self-pay | Admitting: Emergency Medicine

## 2021-05-05 ENCOUNTER — Other Ambulatory Visit: Payer: Self-pay

## 2021-05-05 ENCOUNTER — Ambulatory Visit (HOSPITAL_COMMUNITY)
Admission: EM | Admit: 2021-05-05 | Discharge: 2021-05-05 | Disposition: A | Discharge status: Home / Self Care | Payer: Commercial Managed Care - PPO | Attending: Family Medicine | Admitting: Family Medicine

## 2021-05-05 DIAGNOSIS — J069 Acute upper respiratory infection, unspecified: Secondary | ICD-10-CM | POA: Diagnosis not present

## 2021-05-05 DIAGNOSIS — J3489 Other specified disorders of nose and nasal sinuses: Secondary | ICD-10-CM | POA: Diagnosis not present

## 2021-05-05 MED ORDER — FLUTICASONE PROPIONATE 50 MCG/ACT NA SUSP: 2.0000 | Freq: Every day | NASAL | 0 refills | Status: DC

## 2021-05-05 MED ORDER — AMOXICILLIN-POT CLAVULANATE 875-125 MG PO TABS: 1.0000 | ORAL_TABLET | Freq: Two times a day (BID) | ORAL | 0 refills | Status: AC
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
## 2021-05-05 NOTE — Discharge Instructions (Signed)
Take amoxicillin-clavulanate 875 mg 1 tab twice daily with food for 7 days.  You can help to use Flonase no spray 2 sprays each nostril once daily for the next week or 2  You can also use saline nose spray to rinse your nose and sinuses  It is quite possible that the origin of this is a viral infection and that the oral antibiotics are not required to improve

## 2021-05-05 NOTE — ED Triage Notes (Signed)
Chills last Thursday and Friday Saturday had sneezing Sunday started cough, runny nose, blowing yellow from nose, face pain, headache

## 2021-05-22 ENCOUNTER — Other Ambulatory Visit: Payer: Self-pay | Admitting: Nurse Practitioner

## 2021-05-22 DIAGNOSIS — I10 Essential (primary) hypertension: Secondary | ICD-10-CM

## 2021-06-14 ENCOUNTER — Other Ambulatory Visit: Payer: Self-pay | Admitting: Nurse Practitioner

## 2021-06-14 DIAGNOSIS — I10 Essential (primary) hypertension: Secondary | ICD-10-CM

## 2021-07-08 ENCOUNTER — Other Ambulatory Visit: Payer: Self-pay | Admitting: Nurse Practitioner

## 2021-07-08 DIAGNOSIS — I10 Essential (primary) hypertension: Secondary | ICD-10-CM

## 2021-07-21 ENCOUNTER — Ambulatory Visit: Payer: Commercial Managed Care - PPO | Admitting: Nurse Practitioner

## 2021-07-21 ENCOUNTER — Encounter: Payer: Self-pay | Admitting: Nurse Practitioner

## 2021-07-21 VITALS — BP 144/88 | HR 74 | Temp 98.1°F | Ht 68.0 in | Wt 235.0 lb

## 2021-07-21 DIAGNOSIS — R350 Frequency of micturition: Secondary | ICD-10-CM | POA: Diagnosis not present

## 2021-07-21 DIAGNOSIS — R5383 Other fatigue: Secondary | ICD-10-CM

## 2021-07-21 DIAGNOSIS — R0683 Snoring: Secondary | ICD-10-CM | POA: Diagnosis not present

## 2021-07-21 DIAGNOSIS — R051 Acute cough: Secondary | ICD-10-CM

## 2021-07-21 DIAGNOSIS — N5089 Other specified disorders of the male genital organs: Secondary | ICD-10-CM

## 2021-07-21 LAB — POCT URINALYSIS DIPSTICK
Bilirubin, UA: NEGATIVE
Blood, UA: NEGATIVE
Glucose, UA: NEGATIVE
Ketones, UA: NEGATIVE
Leukocytes, UA: NEGATIVE
Nitrite, UA: NEGATIVE
Protein, UA: NEGATIVE
Spec Grav, UA: 1.025 (ref 1.010–1.025)
Urobilinogen, UA: 0.2 E.U./dL
pH, UA: 5.5 (ref 5.0–8.0)

## 2021-07-21 MED ORDER — IPRATROPIUM BROMIDE 0.06 % NA SOLN: 2.0000 | Freq: Three times a day (TID) | NASAL | 2 refills | Status: DC | PRN

## 2021-07-21 MED ORDER — IPRATROPIUM BROMIDE 0.06 % NA SOLN: 2.0000 | Freq: Three times a day (TID) | NASAL | 2 refills | Status: DC

## 2021-07-21 MED ORDER — BENZONATATE 100 MG PO CAPS: 100.0000 mg | ORAL_CAPSULE | Freq: Four times a day (QID) | ORAL | 1 refills | Status: DC | PRN

## 2021-07-21 NOTE — Patient Instructions (Signed)

## 2021-07-21 NOTE — Progress Notes (Signed)
This visit occurred during the SARS-CoV-2 public health emergency.  Safety protocols were in place, including screening questions prior to the visit, additional usage of staff PPE, and extensive cleaning of exam room while observing appropriate contact time as indicated for disinfecting solutions.  Subjective:     Patient ID: Glenn Carrillo , male    DOB: 09/10/1965 , 56 y.o.   MRN: [id number]   Chief Complaint  Patient presents with   Urinary Frequency    HPI  Patient presents today for cough and urinary frequency. Feels like his testicles are not feeling right.  He has not seen a Insurance underwriterUrologist.    He has been taking mucinex d.    Urinary Frequency  This is a new problem. The current episode started in the past 7 days (2 days ago). The quality of the pain is described as burning. He is Not sexually active. There is No history of pyelonephritis. Associated symptoms include frequency. Pertinent negatives include no chills, hesitancy, nausea or urgency. There is no history of catheterization.  Cough This is a new problem. The current episode started in the past 7 days. The cough is Non-productive. Pertinent negatives include no chills, ear pain, fever or wheezing. Associated symptoms comments: Chest tightness. Treatments tried: mucinex and [facility] allergy medication steroid nasal spray. There is no history of asthma.    Past Medical History:  Diagnosis Date   Headache    Hypertension      Family History  Problem Relation Age of Onset   Hypertension Other      Current Outpatient Medications:    benzonatate (TESSALON PERLES) 100 MG capsule, Take 1 capsule (100 mg total) by mouth every 6 (six) hours as needed., Disp: 30 capsule, Rfl: 1   albuterol (VENTOLIN HFA) 108 (90 Base) MCG/ACT inhaler, Inhale 2 puffs into the lungs every 6 (six) hours as needed for wheezing or shortness of breath., Disp: 8 g, Rfl: 2   amLODipine (NORVASC) 10 MG tablet, TAKE 1 TABLET BY MOUTH EVERY DAY, Disp: 30 tablet,  Rfl: 1   atorvastatin (LIPITOR) 10 MG tablet, TAKE 1 TABLET BY MOUTH EVERY DAY, Disp: 90 tablet, Rfl: 1   cetirizine (ZYRTEC) 10 MG tablet, Take 10 mg by mouth daily as needed for allergies., Disp: , Rfl:    fluticasone (FLONASE) 50 MCG/ACT nasal spray, Place 2 sprays into both nostrils daily., Disp: 16 g, Rfl: 0   ibuprofen (ADVIL) 600 MG tablet, Take 1 tablet (600 mg total) by mouth every 6 (six) hours as needed., Disp: 30 tablet, Rfl: 0   ipratropium (ATROVENT) 0.06 % nasal spray, Place 2 sprays into the nose 3 (three) times daily as needed for rhinitis., Disp: 15 mL, Rfl: 2   levocetirizine (XYZAL) 5 MG tablet, TAKE ONE TABLET BY MOUTH ONE TIME DAILY IN THE EVENING (Patient not taking: Reported on 05/05/2021), Disp: 90 tablet, Rfl: 1   mometasone (ELOCON) 0.1 % cream, Apply 1 application topically daily., Disp: 45 g, Rfl: 0   naproxen (NAPROSYN) 375 MG tablet, Take 1 tablet (375 mg total) by mouth 2 (two) times daily with a meal., Disp: 15 tablet, Rfl: 0   Olopatadine HCl 0.7 % SOLN, Apply 1 drop to eye daily as needed., Disp: , Rfl:    sildenafil (VIAGRA) 50 MG tablet, Take 1 tablet (50 mg total) by mouth as needed for erectile dysfunction., Disp: 20 tablet, Rfl: 0   tizanidine (ZANAFLEX) 2 MG capsule, Take 1 capsule (2 mg total) by mouth 3 (three) times daily. (  Patient not taking: Reported on 04/29/2021), Disp: 21 capsule, Rfl: 0   Allergies  Allergen Reactions   Chlorhexidine Itching   Prednisone     ACHY JOINTS     Review of Systems  Constitutional: Negative.  Negative for chills and fever.  HENT:  Positive for congestion. Negative for ear pain.   Respiratory:  Positive for cough. Negative for wheezing.   Cardiovascular: Negative.   Gastrointestinal: Negative.  Negative for nausea.  Genitourinary:  Positive for frequency. Negative for hesitancy and urgency.  Neurological: Negative.   Psychiatric/Behavioral: Negative.      Today's Vitals   07/21/21 1448  BP: (!) 144/88  Pulse: 74   Temp: 98.1 F (36.7 C)  TempSrc: Oral  Weight: 235 lb (106.6 kg)  Height: 5\' 8"  (1.727 m)   Body mass index is 35.73 kg/m.   Objective:  Physical Exam Vitals reviewed.  Constitutional:      General: He is not in acute distress.    Appearance: Normal appearance. He is obese.  HENT:     Head: Normocephalic.     Nose: Nose normal. No congestion.     Mouth/Throat:     Mouth: Mucous membranes are moist.  Cardiovascular:     Rate and Rhythm: Normal rate and regular rhythm.     Pulses: Normal pulses.     Heart sounds: Normal heart sounds. No murmur heard. Pulmonary:     Effort: Pulmonary effort is normal. No respiratory distress.     Breath sounds: Normal breath sounds. No wheezing.  Genitourinary:    Testes:        Right: Mass or tenderness not present.        Left: Mass present. Tenderness not present.  Musculoskeletal:     Cervical back: Normal range of motion and neck supple.  Skin:    General: Skin is warm and dry.     Capillary Refill: Capillary refill takes less than 2 seconds.  Neurological:     General: No focal deficit present.     Mental Status: He is alert and oriented to person, place, and time.     Cranial Nerves: No cranial nerve deficit.     Motor: No weakness.  Psychiatric:        Mood and Affect: Mood normal.        Behavior: Behavior normal.        Thought Content: Thought content normal.        Judgment: Judgment normal.        Assessment And Plan:     1. Urinary frequency - POCT Urinalysis Dipstick (81002) - PSA  2. Acute cough Comments: Will treat with ipratropium nasal spray if not better next week return call to office - benzonatate (TESSALON PERLES) 100 MG capsule; Take 1 capsule (100 mg total) by mouth every 6 (six) hours as needed.  Dispense: 30 capsule; Refill: 1 - ipratropium (ATROVENT) 0.06 % nasal spray; Place 2 sprays into the nose 3 (three) times daily as needed for rhinitis.  Dispense: 15 mL; Refill: 2  3. Other  fatigue Comments: will check for metabolic cause. - Vitamin B12 - TSH - CBC  4. Snoring Comments: Intermittent more when he is overly tired. Pending labs for metabolic cause will send for sleep study.  5. Scrotal mass Comments: will check scrotal ultrasound due to having enlarged testicle wiht firm mass - Scrotum; Future     Patient was given opportunity to ask questions. Patient verbalized understanding of the plan and  was able to repeat key elements of the plan. All questions were answered to their satisfaction.  Arnette Felts, FNP   I, Arnette Felts, FNP, have reviewed all documentation for this visit. The documentation on 07/21/21 for the exam, diagnosis, procedures, and orders are all accurate and complete.   IF YOU HAVE BEEN REFERRED TO A SPECIALIST, IT MAY TAKE 1-2 WEEKS TO SCHEDULE/PROCESS THE REFERRAL. IF YOU HAVE NOT HEARD FROM US/SPECIALIST IN TWO WEEKS, PLEASE GIVE [place] A CALL AT [phone].   THE PATIENT IS ENCOURAGED TO PRACTICE SOCIAL DISTANCING DUE TO THE COVID-19 PANDEMIC.

## 2021-07-22 LAB — CBC
Hematocrit: 41.1 % (ref 37.5–51.0)
Hemoglobin: 14 g/dL (ref 13.0–17.7)
MCH: 29.2 pg (ref 26.6–33.0)
MCHC: 34.1 g/dL (ref 31.5–35.7)
MCV: 86 fL (ref 79–97)
Platelets: 220 10*3/uL (ref 150–450)
RBC: 4.79 x10E6/uL (ref 4.14–5.80)
RDW: 13.5 % (ref 11.6–15.4)
WBC: 6.7 10*3/uL (ref 3.4–10.8)

## 2021-07-22 LAB — PSA: Prostate Specific Ag, Serum: 1.2 ng/mL (ref 0.0–4.0)

## 2021-07-22 LAB — VITAMIN B12: Vitamin B-12: 699 pg/mL (ref 232–1245)

## 2021-07-22 LAB — TSH: TSH: 1.48 u[IU]/mL (ref 0.450–4.500)

## 2021-07-23 ENCOUNTER — Other Ambulatory Visit: Payer: Self-pay

## 2021-07-23 MED ORDER — AMOXICILLIN-POT CLAVULANATE 875-125 MG PO TABS: 1.0000 | ORAL_TABLET | Freq: Two times a day (BID) | ORAL | 0 refills | Status: AC

## 2021-07-29 ENCOUNTER — Ambulatory Visit
Admission: RE | Admit: 2021-07-29 | Discharge: 2021-07-29 | Disposition: A | Discharge status: Home / Self Care | Payer: Commercial Managed Care - PPO | Source: Ambulatory Visit | Attending: Nurse Practitioner | Admitting: Nurse Practitioner

## 2021-07-29 DIAGNOSIS — N5089 Other specified disorders of the male genital organs: Secondary | ICD-10-CM

## 2021-08-03 ENCOUNTER — Other Ambulatory Visit: Payer: Self-pay | Admitting: Nurse Practitioner

## 2021-08-03 DIAGNOSIS — R051 Acute cough: Secondary | ICD-10-CM

## 2021-08-04 ENCOUNTER — Ambulatory Visit
Admission: RE | Admit: 2021-08-04 | Discharge: 2021-08-04 | Disposition: A | Discharge status: Home / Self Care | Payer: Commercial Managed Care - PPO | Source: Ambulatory Visit | Attending: Nurse Practitioner | Admitting: Nurse Practitioner

## 2021-08-04 DIAGNOSIS — R051 Acute cough: Secondary | ICD-10-CM

## 2021-08-05 ENCOUNTER — Other Ambulatory Visit: Payer: Self-pay | Admitting: Nurse Practitioner

## 2021-08-05 DIAGNOSIS — N5089 Other specified disorders of the male genital organs: Secondary | ICD-10-CM

## 2021-08-06 ENCOUNTER — Other Ambulatory Visit: Payer: Self-pay

## 2021-08-06 MED ORDER — NOREL AD 4-10-325 MG PO TABS: ORAL_TABLET | ORAL | 0 refills | Status: DC

## 2021-08-11 ENCOUNTER — Ambulatory Visit (INDEPENDENT_AMBULATORY_CARE_PROVIDER_SITE_OTHER): Payer: Commercial Managed Care - PPO | Admitting: Nurse Practitioner

## 2021-08-11 ENCOUNTER — Encounter: Payer: Self-pay | Admitting: Nurse Practitioner

## 2021-08-11 VITALS — BP 106/80 | HR 73 | Temp 98.1°F | Ht 68.0 in | Wt 238.6 lb

## 2021-08-11 DIAGNOSIS — I10 Essential (primary) hypertension: Secondary | ICD-10-CM | POA: Diagnosis not present

## 2021-08-11 DIAGNOSIS — J209 Acute bronchitis, unspecified: Secondary | ICD-10-CM

## 2021-08-11 DIAGNOSIS — E782 Mixed hyperlipidemia: Secondary | ICD-10-CM | POA: Diagnosis not present

## 2021-08-11 MED ORDER — TRIAMCINOLONE ACETONIDE 40 MG/ML IJ SUSP
40.0000 mg | Freq: Once | INTRAMUSCULAR | Status: AC
  Administered 2021-08-11: 40 mg via INTRAMUSCULAR

## 2021-08-11 MED ORDER — ATORVASTATIN CALCIUM 10 MG PO TABS: 10.0000 mg | ORAL_TABLET | Freq: Every day | ORAL | 1 refills | Status: DC

## 2021-08-11 MED ORDER — AMLODIPINE BESYLATE 10 MG PO TABS: 10.0000 mg | ORAL_TABLET | Freq: Every day | ORAL | 1 refills | Status: DC

## 2021-08-11 NOTE — Patient Instructions (Signed)

## 2021-08-11 NOTE — Progress Notes (Signed)
I,[name],acting as a Neurosurgeon for Arnette Felts, FNP.,have documented all relevant documentation on the behalf of Arnette Felts, FNP,as directed by  Arnette Felts, FNP while in the presence of Arnette Felts, FNP.   This visit occurred during the SARS-CoV-2 public health emergency.  Safety protocols were in place, including screening questions prior to the visit, additional usage of staff PPE, and extensive cleaning of exam room while observing appropriate contact time as indicated for disinfecting solutions.  Subjective:     Patient ID: Glenn Carrillo , male    DOB: Mar 04, 1966 , 56 y.o.   MRN: [id number]   Chief Complaint  Patient presents with   Cough    HPI  Pt presents today for prolonged sinus infection symptoms.  He reports mucus he feels sits at the bottom of his throat which makes him cough.  He states he has tried mucinex which he feels is not working. He takes allergy medicine, [facility] brand.  In the morning will have more congestion in morning. He has been using the ipratropium nasal spray. He reports drinking about 8 bottles of water a day.   Cough This is a chronic problem. The current episode started more than 1 year ago. The cough is Productive of sputum. Pertinent negatives include no chest pain, chills, sore throat, shortness of breath or wheezing. The symptoms are aggravated by lying down (drinking hot drinks will cause the symptoms to worsen). He has tried ipratropium inhaler and OTC cough suppressant for the symptoms. There is no history of asthma or bronchitis.    Past Medical History:  Diagnosis Date   Headache    Hypertension      Family History  Problem Relation Age of Onset   Hypertension Other      Current Outpatient Medications:    albuterol (VENTOLIN HFA) 108 (90 Base) MCG/ACT inhaler, Inhale 2 puffs into the lungs every 6 (six) hours as needed for wheezing or shortness of breath., Disp: 8 g, Rfl: 2   benzonatate (TESSALON PERLES) 100 MG capsule, Take  1 capsule (100 mg total) by mouth every 6 (six) hours as needed., Disp: 30 capsule, Rfl: 1   cetirizine (ZYRTEC) 10 MG tablet, Take 10 mg by mouth daily as needed for allergies., Disp: , Rfl:    Chlorphen-PE-Acetaminophen (NOREL AD) 4-10-325 MG TABS, Take one tablet by mouth twice daily as needed for congestion., Disp: 84 tablet, Rfl: 0   fluticasone (FLONASE) 50 MCG/ACT nasal spray, Place 2 sprays into both nostrils daily., Disp: 16 g, Rfl: 0   ibuprofen (ADVIL) 600 MG tablet, Take 1 tablet (600 mg total) by mouth every 6 (six) hours as needed., Disp: 30 tablet, Rfl: 0   ipratropium (ATROVENT) 0.06 % nasal spray, Place 2 sprays into the nose 3 (three) times daily as needed for rhinitis., Disp: 15 mL, Rfl: 2   mometasone (ELOCON) 0.1 % cream, Apply 1 application topically daily., Disp: 45 g, Rfl: 0   naproxen (NAPROSYN) 375 MG tablet, Take 1 tablet (375 mg total) by mouth 2 (two) times daily with a meal., Disp: 15 tablet, Rfl: 0   Olopatadine HCl 0.7 % SOLN, Apply 1 drop to eye daily as needed., Disp: , Rfl:    sildenafil (VIAGRA) 50 MG tablet, Take 1 tablet (50 mg total) by mouth as needed for erectile dysfunction., Disp: 20 tablet, Rfl: 0   amLODipine (NORVASC) 10 MG tablet, TAKE 1 TABLET BY MOUTH EVERY DAY, Disp: 30 tablet, Rfl: 1   atorvastatin (LIPITOR) 10 MG tablet,  Take 1 tablet (10 mg total) by mouth daily., Disp: 90 tablet, Rfl: 1   levocetirizine (XYZAL) 5 MG tablet, TAKE ONE TABLET BY MOUTH ONE TIME DAILY IN THE EVENING (Patient not taking: Reported on 05/05/2021), Disp: 90 tablet, Rfl: 1   tizanidine (ZANAFLEX) 2 MG capsule, Take 1 capsule (2 mg total) by mouth 3 (three) times daily. (Patient not taking: Reported on 04/29/2021), Disp: 21 capsule, Rfl: 0   Allergies  Allergen Reactions   Chlorhexidine Itching   Prednisone     ACHY JOINTS     Review of Systems  Constitutional: Negative.  Negative for chills.  HENT: Negative.  Negative for sore throat.   Respiratory:  Positive for  cough. Negative for shortness of breath and wheezing.   Cardiovascular: Negative.  Negative for chest pain.  Gastrointestinal: Negative.   Genitourinary: Negative.   Musculoskeletal: Negative.   Skin: Negative.   Allergic/Immunologic: Negative.   Neurological: Negative.   Hematological: Negative.  106/80   Today's Vitals   08/11/21 1544  BP: 106/80  Pulse: 73  Temp: 98.1 F (36.7 C)  SpO2: 98%  Weight: 238 lb 9.6 oz (108.2 kg)  Height: 5\' 8"  (1.727 m)   Body mass index is 36.28 kg/m.  Wt Readings from Last 3 Encounters:  08/11/21 238 lb 9.6 oz (108.2 kg)  07/21/21 235 lb (106.6 kg)  04/29/21 237 lb (107.5 kg)    Objective:  Physical Exam Vitals reviewed.  Constitutional:      General: He is not in acute distress.    Appearance: Normal appearance.  Cardiovascular:     Rate and Rhythm: Normal rate and regular rhythm.     Pulses: Normal pulses.     Heart sounds: Normal heart sounds. No murmur heard. Neurological:     Mental Status: He is alert.        Assessment And Plan:     1. Acute bronchitis, unspecified organism Comments: Symptoms have been persistent with a dry hacking cough with mild amount of secretions - triamcinolone acetonide (KENALOG-40) injection 40 mg  2. Essential hypertension Comments: Blood pressure is well controlled, continue current medications   3. Mixed hyperlipidemia Comments: Cholesterol slightly elevated, diet controlled. Continue low fat diet - atorvastatin (LIPITOR) 10 MG tablet; Take 1 tablet (10 mg total) by mouth daily.  Dispense: 90 tablet; Refill: 1     Patient was given opportunity to ask questions. Patient verbalized understanding of the plan and was able to repeat key elements of the plan. All questions were answered to their satisfaction.  06/27/21, FNP    I, Arnette Felts, FNP, have reviewed all documentation for this visit. The documentation on 08/11/21 for the exam, diagnosis, procedures, and orders are all accurate  and complete.   IF YOU HAVE BEEN REFERRED TO A SPECIALIST, IT MAY TAKE 1-2 WEEKS TO SCHEDULE/PROCESS THE REFERRAL. IF YOU HAVE NOT HEARD FROM US/SPECIALIST IN TWO WEEKS, PLEASE GIVE 08/13/21 A CALL AT [phone].   THE PATIENT IS ENCOURAGED TO PRACTICE SOCIAL DISTANCING DUE TO THE COVID-19 PANDEMIC.

## 2021-08-12 ENCOUNTER — Other Ambulatory Visit: Payer: Self-pay | Admitting: Nurse Practitioner

## 2021-08-12 DIAGNOSIS — I10 Essential (primary) hypertension: Secondary | ICD-10-CM

## 2021-08-14 ENCOUNTER — Encounter: Payer: Self-pay | Admitting: Nurse Practitioner

## 2021-08-26 ENCOUNTER — Other Ambulatory Visit: Payer: Self-pay | Admitting: Nurse Practitioner

## 2021-08-26 DIAGNOSIS — I10 Essential (primary) hypertension: Secondary | ICD-10-CM

## 2021-09-07 ENCOUNTER — Ambulatory Visit: Payer: Commercial Managed Care - PPO | Admitting: Nurse Practitioner
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
## 2021-09-09 ENCOUNTER — Encounter: Payer: Commercial Managed Care - PPO | Admitting: Nurse Practitioner

## 2021-09-29 ENCOUNTER — Encounter: Payer: Self-pay | Admitting: Nurse Practitioner

## 2021-09-29 ENCOUNTER — Other Ambulatory Visit: Payer: Self-pay

## 2021-09-29 ENCOUNTER — Ambulatory Visit (INDEPENDENT_AMBULATORY_CARE_PROVIDER_SITE_OTHER): Payer: Commercial Managed Care - PPO | Admitting: Nurse Practitioner

## 2021-09-29 VITALS — BP 118/84 | HR 73 | Temp 98.1°F | Ht 68.0 in | Wt 225.8 lb

## 2021-09-29 DIAGNOSIS — R7303 Prediabetes: Secondary | ICD-10-CM | POA: Diagnosis not present

## 2021-09-29 DIAGNOSIS — E6609 Other obesity due to excess calories: Secondary | ICD-10-CM | POA: Diagnosis not present

## 2021-09-29 DIAGNOSIS — Z2821 Immunization not carried out because of patient refusal: Secondary | ICD-10-CM

## 2021-09-29 DIAGNOSIS — E782 Mixed hyperlipidemia: Secondary | ICD-10-CM | POA: Diagnosis not present

## 2021-09-29 DIAGNOSIS — Z6834 Body mass index (BMI) 34.0-34.9, adult: Secondary | ICD-10-CM

## 2021-09-29 DIAGNOSIS — Z79899 Other long term (current) drug therapy: Secondary | ICD-10-CM

## 2021-09-29 DIAGNOSIS — I10 Essential (primary) hypertension: Secondary | ICD-10-CM | POA: Diagnosis not present

## 2021-09-29 DIAGNOSIS — Z125 Encounter for screening for malignant neoplasm of prostate: Secondary | ICD-10-CM

## 2021-09-29 DIAGNOSIS — L918 Other hypertrophic disorders of the skin: Secondary | ICD-10-CM

## 2021-09-29 DIAGNOSIS — M62838 Other muscle spasm: Secondary | ICD-10-CM

## 2021-09-29 DIAGNOSIS — Z0000 Encounter for general adult medical examination without abnormal findings: Secondary | ICD-10-CM | POA: Diagnosis not present

## 2021-09-29 LAB — POCT URINALYSIS DIPSTICK
Bilirubin, UA: NEGATIVE
Blood, UA: NEGATIVE
Glucose, UA: NEGATIVE
Ketones, UA: NEGATIVE
Leukocytes, UA: NEGATIVE
Nitrite, UA: NEGATIVE
Protein, UA: NEGATIVE
Spec Grav, UA: 1.015 (ref 1.010–1.025)
Urobilinogen, UA: 0.2 E.U./dL
pH, UA: 7 (ref 5.0–8.0)

## 2021-09-29 MED ORDER — CYCLOBENZAPRINE HCL 10 MG PO TABS: 10.0000 mg | ORAL_TABLET | Freq: Three times a day (TID) | ORAL | 0 refills | Status: DC | PRN

## 2021-09-29 MED ORDER — ATORVASTATIN CALCIUM 10 MG PO TABS: 10.0000 mg | ORAL_TABLET | Freq: Every day | ORAL | 1 refills | Status: DC

## 2021-09-29 NOTE — Progress Notes (Signed)
I,Glenn Carrillo,acting as a Education administrator for Glenn Brine, FNP.,have documented all relevant documentation on the behalf of Glenn Brine, FNP,as directed by  Glenn Brine, FNP while in the presence of Glenn Carrillo, [place].  This visit occurred during the SARS-CoV-2 public health emergency.  Safety protocols were in place, including screening questions prior to the visit, additional usage of staff PPE, and extensive cleaning of exam room while observing appropriate contact time as indicated for disinfecting solutions.  Subjective:     Patient ID: Glenn Carrillo , male    DOB: 1965/04/09 , 56 y.o.   MRN: [id number]   Chief Complaint  Patient presents with   Annual Exam    HPI  Pt here for HM.  He reports having colonoscopy scheduled for next week.      Past Medical History:  Diagnosis Date   Allergies    Elevated cholesterol    Headache    Hypertension      Family History  Problem Relation Age of Onset   Colon polyps Mother    Dementia Mother    Prostate cancer Father    Diabetes Paternal Grandfather    Colon cancer Neg Hx    Esophageal cancer Neg Hx    Pancreatic cancer Neg Hx    Stomach cancer Neg Hx      Current Outpatient Medications:    albuterol (VENTOLIN HFA) 108 (90 Base) MCG/ACT inhaler, Inhale 2 puffs into the lungs every 6 (six) hours as needed for wheezing or shortness of breath., Disp: 8 g, Rfl: 2   amLODipine (NORVASC) 10 MG tablet, TAKE 1 TABLET BY MOUTH EVERY DAY, Disp: 90 tablet, Rfl: 1   benzonatate (TESSALON PERLES) 100 MG capsule, Take 1 capsule (100 mg total) by mouth every 6 (six) hours as needed., Disp: 30 capsule, Rfl: 1   Chlorphen-PE-Acetaminophen (NOREL AD) 4-10-325 MG TABS, Take one tablet by mouth twice daily as needed for congestion., Disp: 84 tablet, Rfl: 0   cyclobenzaprine (FLEXERIL) 10 MG tablet, Take 1 tablet (10 mg total) by mouth 3 (three) times daily as needed for muscle spasms., Disp: 30 tablet, Rfl: 0   fluticasone (FLONASE) 50 MCG/ACT  nasal spray, Place 2 sprays into both nostrils daily., Disp: 16 g, Rfl: 0   ibuprofen (ADVIL) 600 MG tablet, Take 1 tablet (600 mg total) by mouth every 6 (six) hours as needed., Disp: 30 tablet, Rfl: 0   ipratropium (ATROVENT) 0.06 % nasal spray, Place 2 sprays into the nose 3 (three) times daily as needed for rhinitis. (Patient not taking: Reported on 10/08/2021), Disp: 15 mL, Rfl: 2   mometasone (ELOCON) 0.1 % cream, Apply 1 application topically daily., Disp: 45 g, Rfl: 0   naproxen (NAPROSYN) 375 MG tablet, Take 1 tablet (375 mg total) by mouth 2 (two) times daily with a meal., Disp: 15 tablet, Rfl: 0   Olopatadine HCl 0.7 % SOLN, Apply 1 drop to eye daily as needed. (Patient not taking: Reported on 10/08/2021), Disp: , Rfl:    sildenafil (VIAGRA) 50 MG tablet, Take 1 tablet (50 mg total) by mouth as needed for erectile dysfunction., Disp: 20 tablet, Rfl: 0   atorvastatin (LIPITOR) 10 MG tablet, Take 1 tablet (10 mg total) by mouth daily., Disp: 90 tablet, Rfl: 1   cetirizine (ZYRTEC) 10 MG tablet, Take 1 tablet (10 mg total) by mouth daily as needed for allergies., Disp: 90 tablet, Rfl: 1   levocetirizine (XYZAL) 5 MG tablet, TAKE ONE TABLET BY MOUTH ONE TIME DAILY IN  THE EVENING (Patient not taking: Reported on 10/08/2021), Disp: 90 tablet, Rfl: 1   tizanidine (ZANAFLEX) 2 MG capsule, Take 1 capsule (2 mg total) by mouth 3 (three) times daily. (Patient not taking: Reported on 10/08/2021), Disp: 21 capsule, Rfl: 0   Allergies  Allergen Reactions   Chlorhexidine Itching   Prednisone     ACHY JOINTS     Men's preventive visit. Patient Health Questionnaire (PHQ-2) is  [place] Office Visit from 07/21/2021 in [facility]  PHQ-2 Total Score 0      Patient is on a low fat diet, he is cutting back on his intake and limiting his carbohydrates and eating more vegetables.  Only eating processed foods once a month if at all. He may drink 2 - 16 oz sodas a month. He has also  cut back on his ice cream. [facility] for about 2 [facility].  Marital status: Married. Relevant history for alcohol use is:  Social History   Substance and Sexual Activity  Alcohol Use Yes   Comment: 8 beers per week  . Relevant history for tobacco use is:  Social History   Tobacco Use  Smoking Status Former   Types: Cigarettes   Quit date: 11/16/1997   Years since quitting: 23.9  Smokeless Tobacco Never  .   Review of Systems  Constitutional: Negative.   HENT: Negative.    Eyes: Negative.   Respiratory: Negative.    Cardiovascular: Negative.   Endocrine: Negative.   Genitourinary: Negative.   Musculoskeletal: Negative.   Skin: Negative.   Neurological: Negative.   Hematological: Negative.      Today's Vitals   09/29/21 1409  BP: 118/84  Pulse: 73  Temp: 98.1 F (36.7 C)  SpO2: 94%  Weight: 225 lb 12.8 oz (102.4 kg)  Height: '5\' 8"'  (1.727 m)  PainSc: 0-No pain   Body mass index is 34.33 kg/m.  Wt Readings from Last 3 Encounters:  10/08/21 225 lb 6 oz (102.2 kg)  09/29/21 225 lb 12.8 oz (102.4 kg)  08/11/21 238 lb 9.6 oz (108.2 kg)    Objective:  Physical Exam Vitals reviewed.  Constitutional:      Appearance: Normal appearance. He is obese.  HENT:     Head: Normocephalic and atraumatic.     Right Ear: Tympanic membrane, ear canal and external ear normal. There is no impacted cerumen.     Left Ear: Tympanic membrane, ear canal and external ear normal. There is no impacted cerumen.  Cardiovascular:     Rate and Rhythm: Normal rate and regular rhythm.     Pulses: Normal pulses.     Heart sounds: Normal heart sounds. No murmur heard. Pulmonary:     Effort: Pulmonary effort is normal. No respiratory distress.     Breath sounds: Normal breath sounds.  Abdominal:     General: Abdomen is flat. Bowel sounds are normal. There is no distension.     Palpations: Abdomen is soft.  Genitourinary:    Prostate: Normal.     Rectum:  Guaiac result negative.  Musculoskeletal:        General: Normal range of motion.     Cervical back: Normal range of motion and neck supple.  Skin:    General: Skin is warm.     Capillary Refill: Capillary refill takes less than 2 seconds.  Neurological:     General: No focal deficit present.     Mental Status:  He is alert and oriented to person, place, and time.     Cranial Nerves: No cranial nerve deficit.     Motor: No weakness.  Psychiatric:        Mood and Affect: Mood normal.        Behavior: Behavior normal.        Thought Content: Thought content normal.        Judgment: Judgment normal.         Assessment And Plan:    1. Encounter for annual physical exam Behavior modifications discussed and diet history reviewed.   Pt will continue to exercise regularly and modify diet with low GI, plant based foods and decrease intake of processed foods.  Recommend intake of daily multivitamin, Vitamin D, and calcium.  Recommend mammogram and colonoscopy for preventive screenings, as well as recommend immunizations that include influenza, TDAP, and Shingles (declined)  2. Encounter for prostate cancer screening Comments: Manual Prostate exam is normal.  - PSA  3. Other long term (current) drug therapy - CBC  4. Class 1 obesity due to excess calories without serious comorbidity with body mass index (BMI) of 34.0 to 34.9 in adult Chronic Discussed healthy diet and regular exercise options  Encouraged to exercise at least 150 minutes per week with 2 days of strength training  5. Essential hypertension Comments: Blood pressure is fairly controlled, continue current medications. EKG done with NSR HR 66 - POCT Urinalysis Dipstick (81002) - Microalbumin / creatinine urine ratio - EKG 12-Lead - CMP14+EGFR  6. Mixed hyperlipidemia Comments: Continue statin, tolerating well  - CMP14+EGFR - Lipid panel - CBC  7. Prediabetes Comments: He has not been on any medications for  prediabetes, will check HgbA1c today pending results will consider starting a medication. Discussed risk for complication - Hemoglobin A1c  8. Skin tag Comments: medium sized skin tag to left lateral neck, will refer to Dermatology for removal - Ambulatory referral to Dermatology  9. Neck muscle spasm Comments: Will provide muscle relaxer, neck ROM is limited and stiff. - cyclobenzaprine (FLEXERIL) 10 MG tablet; Take 1 tablet (10 mg total) by mouth 3 (three) times daily as needed for muscle spasms.  Dispense: 30 tablet; Refill: 0  10. Immunization declined Comments: He would like to wait for his shingles vaccine until next visit She is encouraged to strive for BMI less than 30 to decrease cardiac risk. Advised to aim for at least 150 minutes of exercise per week.     Patient was given opportunity to ask questions. Patient verbalized understanding of the plan and was able to repeat key elements of the plan. All questions were answered to their satisfaction.   Glenn Brine, FNP   I, Glenn Brine, FNP, have reviewed all documentation for this visit. The documentation on 09/28/21 for the exam, diagnosis, procedures, and orders are all accurate and complete  THE PATIENT IS ENCOURAGED TO PRACTICE SOCIAL DISTANCING DUE TO THE COVID-19 PANDEMIC.

## 2021-09-29 NOTE — Patient Instructions (Signed)
Health Maintenance, Male Adopting a healthy lifestyle and getting preventive care are important in promoting health and wellness. Ask your health care provider about: The right schedule for you to have regular tests and exams. Things you can do on your own to prevent diseases and keep yourself healthy. What should I know about diet, weight, and exercise? Eat a healthy diet  Eat a diet that includes plenty of vegetables, fruits, low-fat dairy products, and lean protein. Do not eat a lot of foods that are high in solid fats, added sugars, or sodium. Maintain a healthy weight Body mass index (BMI) is a measurement that can be used to identify possible weight problems. It estimates body fat based on height and weight. Your health care provider can help determine your BMI and help you achieve or maintain a healthy weight. Get regular exercise Get regular exercise. This is one of the most important things you can do for your health. Most adults should: Exercise for at least 150 minutes each week. The exercise should increase your heart rate and make you sweat (moderate-intensity exercise). Do strengthening exercises at least twice a week. This is in addition to the moderate-intensity exercise. Spend less time sitting. Even light physical activity can be beneficial. Watch cholesterol and blood lipids Have your blood tested for lipids and cholesterol at 58 years of age, then have this test every 5 years. You may need to have your cholesterol levels checked more often if: Your lipid or cholesterol levels are high. You are older than 58 years of age. You are at high risk for heart disease. What should I know about cancer screening? Many types of cancers can be detected early and may often be prevented. Depending on your health history and family history, you may need to have cancer screening at various ages. This may include screening for: Colorectal cancer. Prostate cancer. Skin cancer. Lung  cancer. What should I know about heart disease, diabetes, and high blood pressure? Blood pressure and heart disease High blood pressure causes heart disease and increases the risk of stroke. This is more likely to develop in people who have high blood pressure readings or are overweight. Talk with your health care provider about your target blood pressure readings. Have your blood pressure checked: Every 3-5 years if you are 18-39 years of age. Every year if you are 40 years old or older. If you are between the ages of 65 and 75 and are a current or former smoker, ask your health care provider if you should have a one-time screening for abdominal aortic aneurysm (AAA). Diabetes Have regular diabetes screenings. This checks your fasting blood sugar level. Have the screening done: Once every three years after age 45 if you are at a normal weight and have a low risk for diabetes. More often and at a younger age if you are overweight or have a high risk for diabetes. What should I know about preventing infection? Hepatitis B If you have a higher risk for hepatitis B, you should be screened for this virus. Talk with your health care provider to find out if you are at risk for hepatitis B infection. Hepatitis C Blood testing is recommended for: Everyone born from 1945 through 1965. Anyone with known risk factors for hepatitis C. Sexually transmitted infections (STIs) You should be screened each year for STIs, including gonorrhea and chlamydia, if: You are sexually active and are younger than 58 years of age. You are older than 58 years of age and your   health care provider tells you that you are at risk for this type of infection. Your sexual activity has changed since you were last screened, and you are at increased risk for chlamydia or gonorrhea. Ask your health care provider if you are at risk. Ask your health care provider about whether you are at high risk for HIV. Your health care provider  may recommend a prescription medicine to help prevent HIV infection. If you choose to take medicine to prevent HIV, you should first get tested for HIV. You should then be tested every 3 months for as long as you are taking the medicine. Follow these instructions at home: Alcohol use Do not drink alcohol if your health care provider tells you not to drink. If you drink alcohol: Limit how much you have to 0-2 drinks a day. Know how much alcohol is in your drink. In the [place] one drink equals one 12 oz bottle of beer (355 mL), one 5 oz glass of wine (148 mL), or one 1 oz glass of hard liquor (44 mL). Lifestyle Do not use any products that contain nicotine or tobacco. These products include cigarettes, chewing tobacco, and vaping devices, such as e-cigarettes. If you need help quitting, ask your health care provider. Do not use street drugs. Do not share needles. Ask your health care provider for help if you need support or information about quitting drugs. General instructions Schedule regular health, dental, and eye exams. Stay current with your vaccines. Tell your health care provider if: You often feel depressed. You have ever been abused or do not feel safe at home. Summary Adopting a healthy lifestyle and getting preventive care are important in promoting health and wellness. Follow your health care provider's instructions about healthy diet, exercising, and getting tested or screened for diseases. Follow your health care provider's instructions on monitoring your cholesterol and blood pressure. This information is not intended to replace advice given to you by your health care provider. Make sure you discuss any questions you have with your health care provider. Document Revised: 07/28/2020 Document Reviewed: 07/28/2020 Elsevier Patient Education  2023 Elsevier Inc.  

## 2021-09-30 LAB — CMP14+EGFR
ALT: 16 IU/L (ref 0–44)
AST: 18 IU/L (ref 0–40)
Albumin/Globulin Ratio: 1.5 (ref 1.2–2.2)
Albumin: 4.2 g/dL (ref 3.8–4.9)
Alkaline Phosphatase: 53 IU/L (ref 44–121)
BUN/Creatinine Ratio: 13 (ref 9–20)
BUN: 13 mg/dL (ref 6–24)
Bilirubin Total: 0.5 mg/dL (ref 0.0–1.2)
CO2: 24 mmol/L (ref 20–29)
Calcium: 9.3 mg/dL (ref 8.7–10.2)
Chloride: 103 mmol/L (ref 96–106)
Creatinine, Ser: 1.04 mg/dL (ref 0.76–1.27)
Globulin, Total: 2.8 g/dL (ref 1.5–4.5)
Glucose: 98 mg/dL (ref 70–99)
Potassium: 4.4 mmol/L (ref 3.5–5.2)
Sodium: 138 mmol/L (ref 134–144)
Total Protein: 7 g/dL (ref 6.0–8.5)
eGFR: 83 mL/min/{1.73_m2} (ref >59)

## 2021-09-30 LAB — LIPID PANEL
Chol/HDL Ratio: 4.3 ratio (ref 0.0–5.0)
Cholesterol, Total: 216 mg/dL — ABNORMAL HIGH (ref 100–199)
HDL: 50 mg/dL (ref >39)
LDL Chol Calc (NIH): 137 mg/dL — ABNORMAL HIGH (ref 0–99)
Triglycerides: 161 mg/dL — ABNORMAL HIGH (ref 0–149)
VLDL Cholesterol Cal: 29 mg/dL (ref 5–40)

## 2021-09-30 LAB — CBC
Hematocrit: 41.3 % (ref 37.5–51.0)
Hemoglobin: 13.9 g/dL (ref 13.0–17.7)
MCH: 29.3 pg (ref 26.6–33.0)
MCHC: 33.7 g/dL (ref 31.5–35.7)
MCV: 87 fL (ref 79–97)
Platelets: 225 10*3/uL (ref 150–450)
RBC: 4.75 x10E6/uL (ref 4.14–5.80)
RDW: 13.8 % (ref 11.6–15.4)
WBC: 6.2 10*3/uL (ref 3.4–10.8)

## 2021-09-30 LAB — HEMOGLOBIN A1C
Est. average glucose Bld gHb Est-mCnc: 134 mg/dL
Hgb A1c MFr Bld: 6.3 % — ABNORMAL HIGH (ref 4.8–5.6)

## 2021-09-30 LAB — PSA: Prostate Specific Ag, Serum: 1.5 ng/mL (ref 0.0–4.0)

## 2021-09-30 LAB — MICROALBUMIN / CREATININE URINE RATIO
Creatinine, Urine: 69.2 mg/dL
Microalb/Creat Ratio: <4 mg/g creat (ref 0–29)
Microalbumin, Urine: <3 ug/mL

## 2021-10-08 ENCOUNTER — Encounter: Payer: Self-pay | Admitting: Nurse Practitioner

## 2021-10-08 ENCOUNTER — Ambulatory Visit: Payer: Commercial Managed Care - PPO | Admitting: Nurse Practitioner

## 2021-10-08 VITALS — BP 110/74 | HR 68 | Ht 68.0 in | Wt 225.4 lb

## 2021-10-08 DIAGNOSIS — Z1211 Encounter for screening for malignant neoplasm of colon: Secondary | ICD-10-CM | POA: Diagnosis not present

## 2021-10-08 MED ORDER — CLENPIQ 10-3.5-12 MG-GM -GM/160ML PO SOLN
320.0000 mL | Freq: Once | ORAL | 0 refills | Status: AC
Start: 2021-10-08 — End: 2021-10-08

## 2021-10-08 NOTE — Patient Instructions (Signed)
If you are age 58 or older, your body mass index should be between 23-30. Your Body mass index is 34.27 kg/m. If this is out of the aforementioned range listed, please consider follow up with your Primary Care Provider.  If you are age 23 or younger, your body mass index should be between 19-25. Your Body mass index is 34.27 kg/m. If this is out of the aformentioned range listed, please consider follow up with your Primary Care Provider.  You have been scheduled for a colonoscopy. Please follow written instructions given to you at your visit today.  Please pick up your prep supplies at the pharmacy within the next 1-3 days. If you use inhalers (even only as needed), please bring them with you on the day of your procedure.   The [facility] providers would like to encourage you to use [facility] to communicate with providers for non-urgent requests or questions.  Due to long hold times on the telephone, sending your provider a message by [facility] may be a faster and more efficient way to get a response.  Please allow 48 business hours for a response.  Please remember that this is for non-urgent requests.   It was a pleasure to see you today!  Thank you for trusting me with your gastrointestinal care!    Willette Cluster, NP
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
## 2021-10-14 ENCOUNTER — Other Ambulatory Visit: Payer: Self-pay

## 2021-10-14 MED ORDER — CETIRIZINE HCL 10 MG PO TABS: 10.0000 mg | ORAL_TABLET | Freq: Every day | ORAL | 1 refills | Status: AC | PRN

## 2021-10-30 NOTE — Progress Notes (Signed)
Agree with assessment/plan.  Raj Kache Mcclurg, MD [facility] [phone]  

## 2021-12-07 ENCOUNTER — Telehealth: Payer: Self-pay | Admitting: Nurse Practitioner

## 2021-12-07 ENCOUNTER — Other Ambulatory Visit: Payer: Self-pay

## 2021-12-07 MED ORDER — CLENPIQ 10-3.5-12 MG-GM -GM/160ML PO SOLN: 320.0000 mL | Freq: Once | ORAL | 0 refills | Status: AC

## 2021-12-07 NOTE — Telephone Encounter (Signed)
Inbound call from patient stating that he is scheduled to have a procedure on 9/25 and he does not have " anything" for his procedure. Patient is requesting a call back to discuss. Please advise.

## 2021-12-09 NOTE — Telephone Encounter (Signed)
Attempted to contact patient but there was a bad connection. I will try again later.

## 2021-12-10 NOTE — Telephone Encounter (Signed)
Contacted patient and her stated that he was able to pick up prep kit.

## 2021-12-14 ENCOUNTER — Encounter: Payer: Self-pay | Admitting: Gastroenterology

## 2021-12-14 ENCOUNTER — Ambulatory Visit (AMBULATORY_SURGERY_CENTER): Payer: Commercial Managed Care - PPO | Admitting: Gastroenterology

## 2021-12-14 VITALS — BP 122/66 | HR 66 | Temp 96.8°F | Resp 18 | Ht 68.0 in | Wt 225.0 lb

## 2021-12-14 DIAGNOSIS — Z1211 Encounter for screening for malignant neoplasm of colon: Secondary | ICD-10-CM

## 2021-12-14 MED ORDER — SODIUM CHLORIDE 0.9 % IV SOLN: 500.0000 mL | Freq: Once | INTRAVENOUS | Status: DC
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
## 2021-12-14 NOTE — Op Note (Signed)
[facility] Patient Name: Glenn Carrillo Procedure Date: 12/14/2021 10:41 AM MRN: [id number] Endoscopist: Lynann Bologna , MD Age: 56 Referring MD:  Date of Birth: 1965/03/31 Gender: Male Account #: [id number] Procedure:                Colonoscopy Indications:              Screening for colorectal malignant neoplasm Medicines:                Monitored Anesthesia Care Procedure:                Pre-Anesthesia Assessment:                           - Prior to the procedure, a History and Physical                            was performed, and patient medications and                            allergies were reviewed. The patient's tolerance of                            previous anesthesia was also reviewed. The risks                            and benefits of the procedure and the sedation                            options and risks were discussed with the patient.                            All questions were answered, and informed consent                            was obtained. Prior Anticoagulants: The patient has                            taken no previous anticoagulant or antiplatelet                            agents. ASA Grade Assessment: II - A patient with                            mild systemic disease. After reviewing the risks                            and benefits, the patient was deemed in                            satisfactory condition to undergo the procedure.                           After obtaining informed consent, the colonoscope  was passed under direct vision. Throughout the                            procedure, the patient's blood pressure, pulse, and                            oxygen saturations were monitored continuously. The                            CF [id number] #[id number] was introduced through the anus                            and advanced to the the cecum, identified by                            appendiceal orifice and  ileocecal valve. The                            colonoscopy was performed without difficulty. The                            patient tolerated the procedure well. The quality                            of the bowel preparation was adequate to identify                            polyps. The ileocecal valve, appendiceal orifice,                            and rectum were photographed. Scope In: 10:48:20 AM Scope Out: 11:00:00 AM Scope Withdrawal Time: 0 hours 8 minutes 23 seconds  Total Procedure Duration: 0 hours 11 minutes 40 seconds  Findings:                 A few medium-mouthed diverticula were found in the                            sigmoid colon and descending colon.                           Non-bleeding internal hemorrhoids were found during                            retroflexion. The hemorrhoids were small and Grade                            I (internal hemorrhoids that do not prolapse).                           The exam was otherwise without abnormality on                            direct and retroflexion views. Complications:  No immediate complications. Estimated Blood Loss:     Estimated blood loss: none. Impression:               - Mild left colonic diverticulosis.                           - Non-bleeding internal hemorrhoids.                           - The examination was otherwise normal on direct                            and retroflexion views.                           - No specimens collected. Recommendation:           - Patient has a contact number available for                            emergencies. The signs and symptoms of potential                            delayed complications were discussed with the                            patient. Return to normal activities tomorrow.                            Written discharge instructions were provided to the                            patient.                           - High fiber diet.                            - Continue present medications.                           - Repeat colonoscopy in 10 years for screening                            purposes. Earlier, if with any new problems or                            change in family history.                           - The findings and recommendations were discussed                            with the patient's family. Lynann Bologna, MD 12/14/2021 11:03:12 AM This report has been signed electronically.

## 2021-12-14 NOTE — Progress Notes (Signed)
PT taken to PACU. Monitors in place. VSS. Report given to RN. 

## 2021-12-14 NOTE — Progress Notes (Signed)
Cell phone off per pt  

## 2021-12-14 NOTE — Patient Instructions (Addendum)
Thank you for letting us take care of your healthcare needs today. Please see handouts given to you on Hemorrhoids and Diverticulosis.    YOU HAD AN ENDOSCOPIC PROCEDURE TODAY AT THE [facility]:   Refer to the procedure report that was given to you for any specific questions about what was found during the examination.  If the procedure report does not answer your questions, please call your gastroenterologist to clarify.  If you requested that your care partner not be given the details of your procedure findings, then the procedure report has been included in a sealed envelope for you to review at your convenience later.  YOU SHOULD EXPECT: Some feelings of bloating in the abdomen. Passage of more gas than usual.  Walking can help get rid of the air that was put into your GI tract during the procedure and reduce the bloating. If you had a lower endoscopy (such as a colonoscopy or flexible sigmoidoscopy) you may notice spotting of blood in your stool or on the toilet paper. If you underwent a bowel prep for your procedure, you may not have a normal bowel movement for a few days.  Please Note:  You might notice some irritation and congestion in your nose or some drainage.  This is from the oxygen used during your procedure.  There is no need for concern and it should clear up in a day or so.  SYMPTOMS TO REPORT IMMEDIATELY:  Following lower endoscopy (colonoscopy or flexible sigmoidoscopy):  Excessive amounts of blood in the stool  Significant tenderness or worsening of abdominal pains  Swelling of the abdomen that is new, acute  Fever of 100F or higher  For urgent or emergent issues, a gastroenterologist can be reached at any hour by calling [phone]. Do not use MyChart messaging for urgent concerns.    DIET:  We do recommend a small meal at first, but then you may proceed to your regular diet.  Drink plenty of fluids but you should avoid alcoholic beverages for 24  hours.  ACTIVITY:  You should plan to take it easy for the rest of today and you should NOT DRIVE or use heavy machinery until tomorrow (because of the sedation medicines used during the test).    FOLLOW UP: Our staff will call the number listed on your records the next business day following your procedure.  We will call around 7:15- 8:00 am to check on you and address any questions or concerns that you may have regarding the information given to you following your procedure. If we do not reach you, we will leave a message.     If any biopsies were taken you will be contacted by phone or by letter within the next 1-3 weeks.  Please call us at [phone] if you have not heard about the biopsies in 3 weeks.    SIGNATURES/CONFIDENTIALITY: You and/or your care partner have signed paperwork which will be entered into your electronic medical record.  These signatures attest to the fact that that the information above on your After Visit Summary has been reviewed and is understood.  Full responsibility of the confidentiality of this discharge information lies with you and/or your care-partner.  

## 2021-12-15 ENCOUNTER — Telehealth: Payer: Self-pay | Admitting: *Deleted

## 2021-12-15 NOTE — Telephone Encounter (Signed)
Post procedure follow up call placed, no answer and left VM.  

## 2021-12-30 ENCOUNTER — Other Ambulatory Visit: Payer: Self-pay

## 2021-12-30 DIAGNOSIS — N528 Other male erectile dysfunction: Secondary | ICD-10-CM

## 2021-12-30 MED ORDER — SILDENAFIL CITRATE 50 MG PO TABS: 50.0000 mg | ORAL_TABLET | ORAL | 0 refills | Status: DC | PRN

## 2022-01-01 IMAGING — CT CT HEAD W/O CM
2 series · 15 of 30 positions shown, 17 images · non-contrast
Comparison: None.

CLINICAL DATA: Primary hypertension. Paresthesia. Neuro deficit,
acute, stroke suspected. Patient reports elevated blood pressure
after steroid injection for neck.

EXAM:
CT HEAD WITHOUT CONTRAST
TECHNIQUE: Contiguous axial images were obtained from the base of the skull
through the vertex without intravenous contrast.

[Series 2: head w/(date) · axial · 0.49mm/px · z∈[-156,-22]mm · 7 of 37 slices shown, 9 images]
[im 5/37  brain]
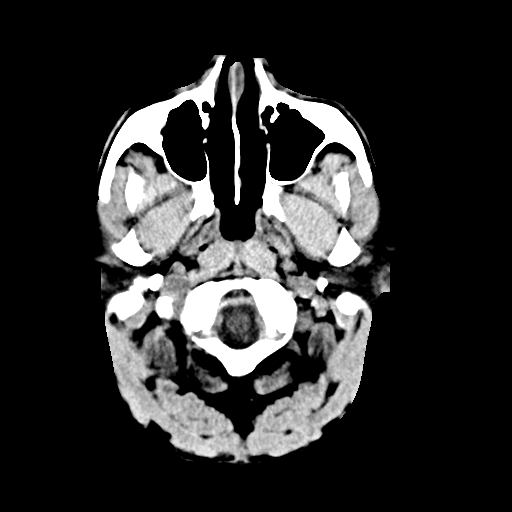
[im 5/37  bone]
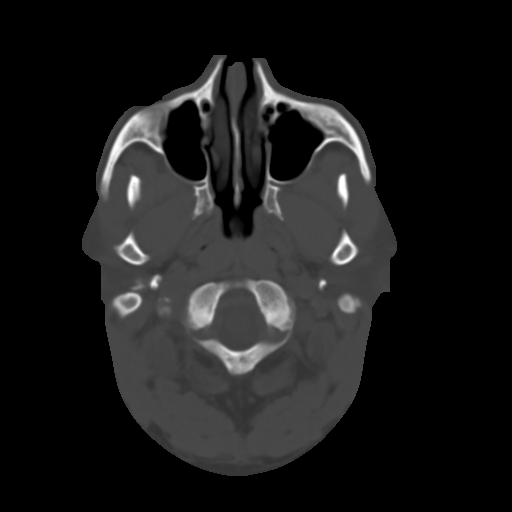
[im 10/37  brain]
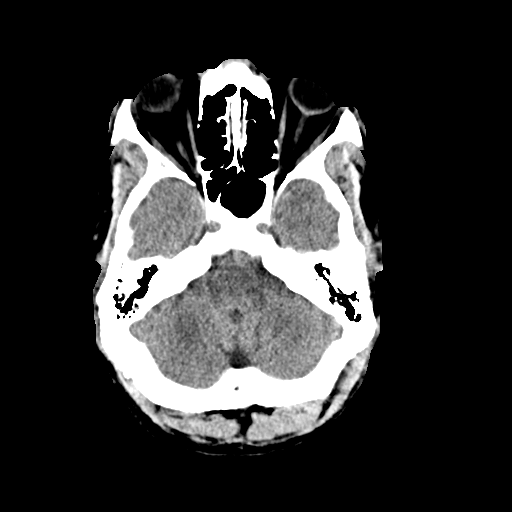
[im 14/37  brain]
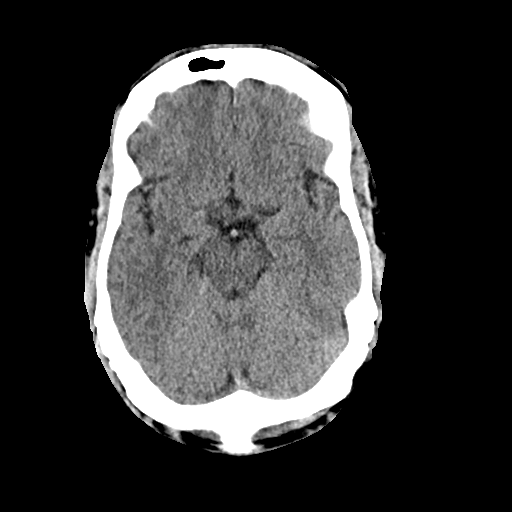
[im 19/37  brain]
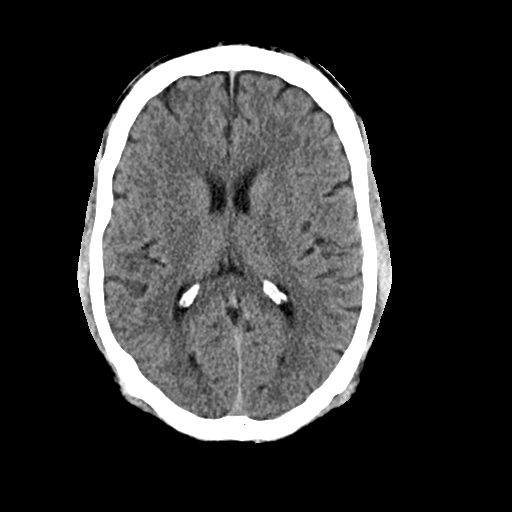
[im 23/37  brain]
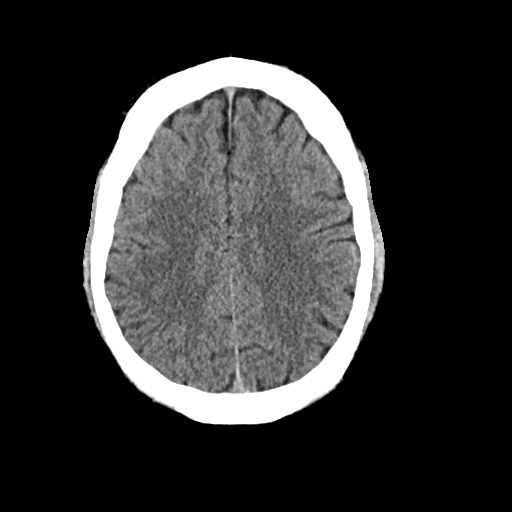
[im 23/37  bone]
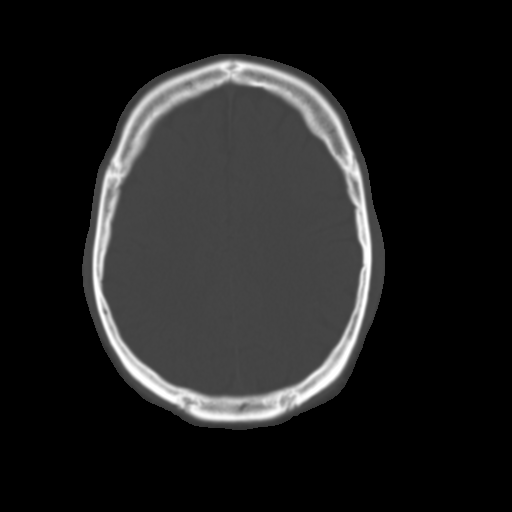
[im 28/37  brain]
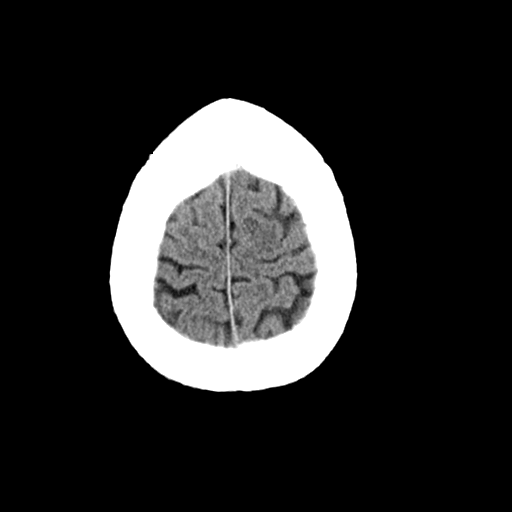
[im 32/37  brain]
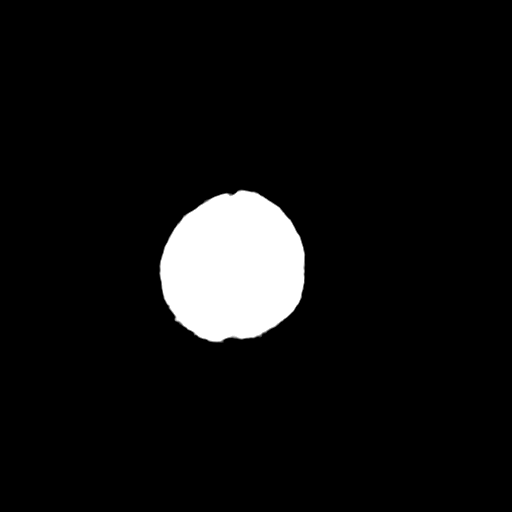

[Series 5: bone · axial · 0.49mm/px · z∈[-158,-14]mm · 8 of 92 slices shown]
[im 10/92  bone]
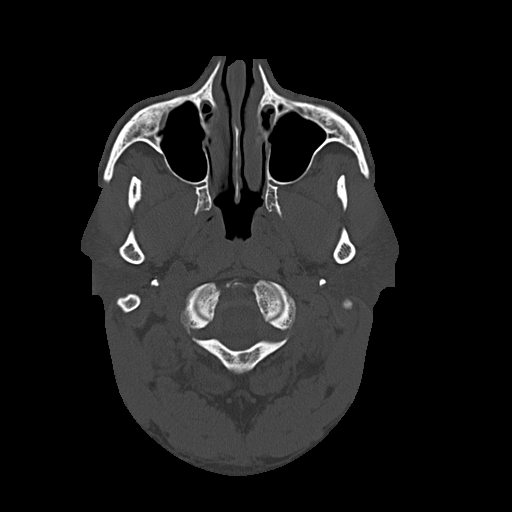
[im 19/92  bone]
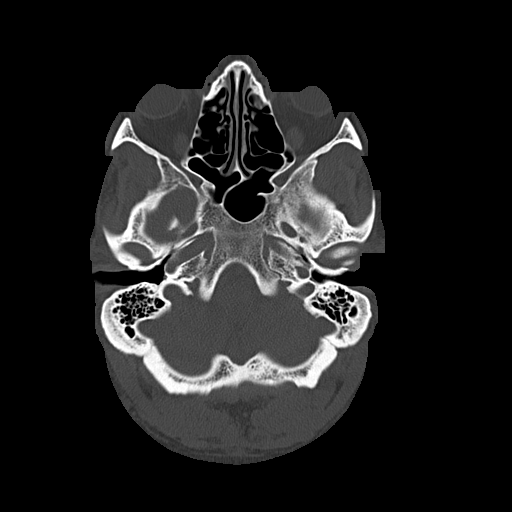
[im 28/92  bone]
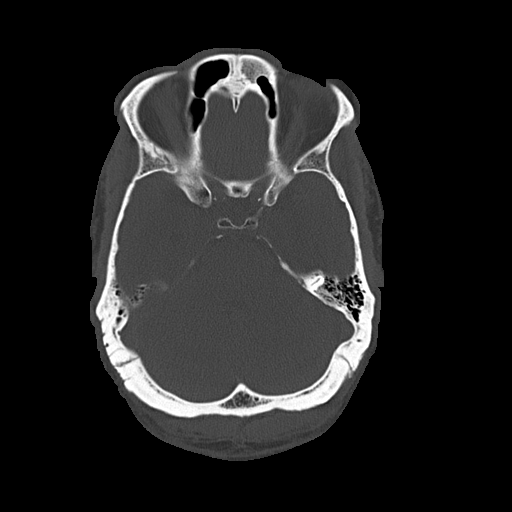
[im 41/92  bone]
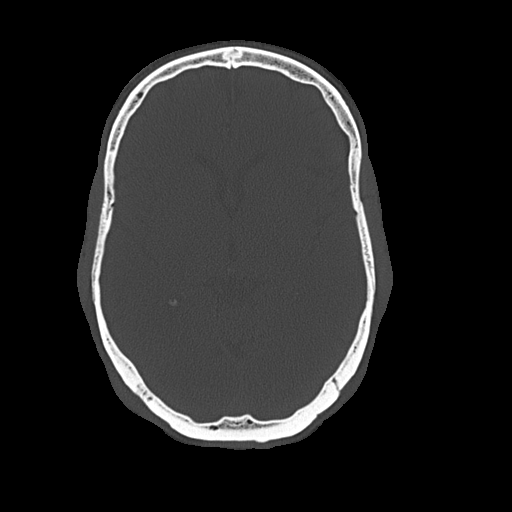
[im 51/92  bone]
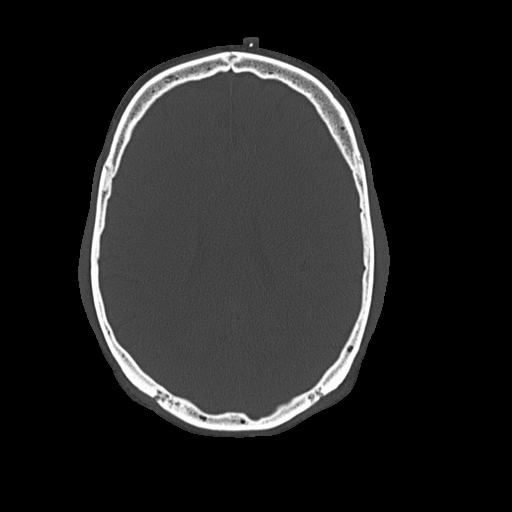
[im 64/92  bone]
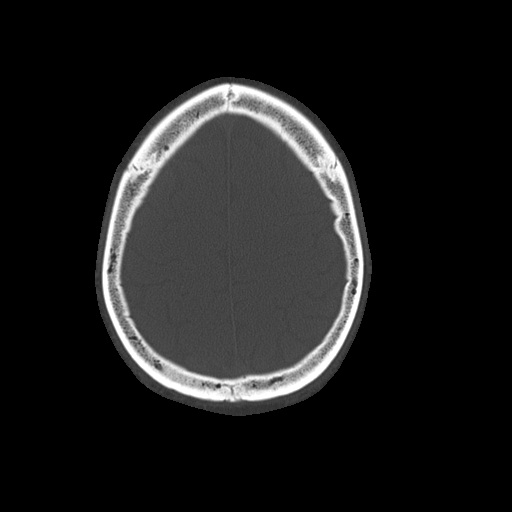
[im 73/92  bone]
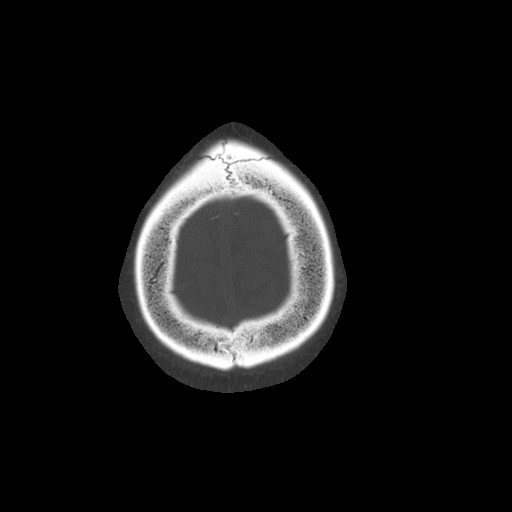
[im 82/92  bone]
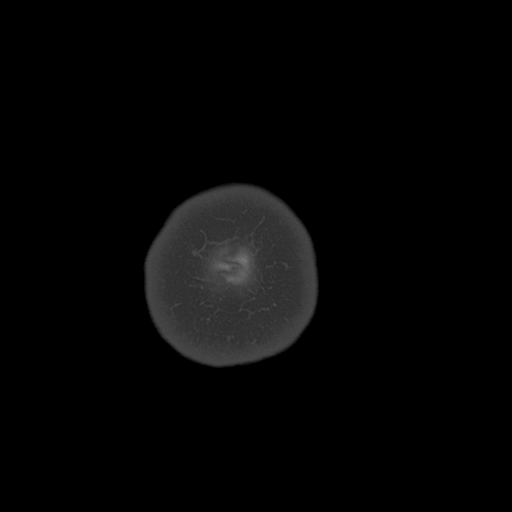

[15 of 30 positions shown; findings below may reference images not displayed]

FINDINGS: Brain: Normal brain volume for age. No intracranial hemorrhage, mass
effect, or midline shift. No hydrocephalus. The basilar cisterns are
patent. No evidence of territorial infarct or acute ischemia. No
encephalomalacia to suggest prior infarct. No extra-axial or
intracranial fluid collection.

Vascular: No hyperdense vessel or unexpected calcification.

Skull: No fracture or focal lesion.

Sinuses/Orbits: Mucous retention cysts in the right left maxillary
sinus. No sinus mucosal thickening or fluid levels. The mastoid air
cells are clear. Orbits are unremarkable.

Other: None.
IMPRESSION: Negative noncontrast head CT.

## 2022-01-11 ENCOUNTER — Other Ambulatory Visit: Payer: Self-pay | Admitting: Nurse Practitioner

## 2022-01-26 ENCOUNTER — Ambulatory Visit (INDEPENDENT_AMBULATORY_CARE_PROVIDER_SITE_OTHER): Payer: Commercial Managed Care - PPO | Admitting: Nurse Practitioner

## 2022-01-26 ENCOUNTER — Encounter: Payer: Self-pay | Admitting: Nurse Practitioner

## 2022-01-26 VITALS — BP 122/84 | HR 75 | Temp 98.0°F | Ht 68.0 in | Wt 230.2 lb

## 2022-01-26 DIAGNOSIS — Z6835 Body mass index (BMI) 35.0-35.9, adult: Secondary | ICD-10-CM

## 2022-01-26 DIAGNOSIS — Z23 Encounter for immunization: Secondary | ICD-10-CM

## 2022-01-26 DIAGNOSIS — M5441 Lumbago with sciatica, right side: Secondary | ICD-10-CM | POA: Diagnosis not present

## 2022-01-26 DIAGNOSIS — E6609 Other obesity due to excess calories: Secondary | ICD-10-CM | POA: Diagnosis not present

## 2022-01-26 MED ORDER — MELOXICAM 15 MG PO TABS: 15.0000 mg | ORAL_TABLET | Freq: Every day | ORAL | 0 refills | Status: DC

## 2022-01-26 MED ORDER — KETOROLAC TROMETHAMINE 60 MG/2ML IM SOLN
60.0000 mg | Freq: Once | INTRAMUSCULAR | Status: AC
  Administered 2022-01-26: 60 mg via INTRAMUSCULAR

## 2022-01-26 NOTE — Patient Instructions (Addendum)
Sciatica Rehab Ask your health care provider which exercises are safe for you. Do exercises exactly as told by your health care provider and adjust them as directed. It is normal to feel mild stretching, pulling, tightness, or discomfort as you do these exercises. Stop right away if you feel sudden pain or your pain gets worse. Do not begin these exercises until told by your health care provider. Stretching and range-of-motion exercises These exercises warm up your muscles and joints and improve the movement and flexibility of your hips and back. These exercises also help to relieve pain, numbness, and tingling. Sciatic nerve glide  Sit in a chair with your head facing down toward your chest. Place your hands behind your back. Let your shoulders slump forward. Slowly straighten one of your legs while you tilt your head back as if you are looking toward the ceiling. Only straighten your leg as far as you can without making your symptoms worse. Hold this position for __________ seconds. Slowly return your leg and head back to the starting position. Repeat with your other leg. Repeat __________ times. Complete this exercise __________ times a day. Knee to chest with hip adduction and internal rotation  Lie on your back on a firm surface with both legs straight. Bend one of your knees and move it up toward your chest until you feel a gentle stretch in your lower back and buttock. Then, move your knee toward the shoulder that is on the opposite side from your leg. This is hip adduction and internal rotation. Hold your leg in this position by holding on to the front of your knee. Hold this position for __________ seconds. Slowly return to the starting position. Repeat with your other leg. Repeat __________ times. Complete this exercise __________ times a day. Prone extension on elbows  Lie on your abdomen on a firm surface. A bed may be too soft for this exercise. Prop yourself up on your  elbows. Use your arms to help lift your chest up until you feel a gentle stretch in your abdomen and your lower back. This will place some of your body weight on your elbows. If this is uncomfortable, try stacking pillows under your chest. Your hips should stay down, against the surface that you are lying on. Keep your hip and back muscles relaxed. Hold this position for __________ seconds. Slowly relax your upper body and return to the starting position. Repeat __________ times. Complete this exercise __________ times a day. Strengthening exercises These exercises build strength and endurance in your back. Endurance is the ability to use your muscles for a long time, even after they get tired. Pelvic tilt This exercise strengthens the muscles that lie deep in the abdomen. Lie on your back on a firm surface. Bend your knees and keep your feet flat on the surface. Tense your abdominal muscles. Tip your pelvis up toward the ceiling and flatten your lower back into the firm surface. To help with this exercise, you may place a small towel under your lower back and try to push your back into the towel. Hold this position for __________ seconds. Let your muscles relax completely before you repeat this exercise. Repeat __________ times. Complete this exercise __________ times a day. Alternating arm and leg raises  Get on your hands and knees on a firm surface. If you are on a hard floor, you may want to use padding, such as an exercise mat, to cushion your knees. Line up your arms and legs. Your   hands should be directly below your shoulders, and your knees should be directly below your hips. Lift your left leg behind you. At the same time, raise your right arm and straighten it in front of you. Do not lift your leg higher than your hip. Do not lift your arm higher than your shoulder. Keep your abdominal and back muscles tight. Keep your hips facing the ground. Do not arch your back. Keep your  balance carefully, and do not hold your breath. Hold this position for __________ seconds. Slowly return to the starting position. Repeat with your right leg and your left arm. Repeat __________ times. Complete this exercise __________ times a day. Posture and body mechanics Good posture and healthy body mechanics can help to relieve stress in your body's tissues and joints. Body mechanics refers to the movements and positions of your body while you do your daily activities. Posture is part of body mechanics. Good posture means: Your spine is in its natural S-curve position (neutral). Your shoulders are pulled back slightly. Your head is not tipped forward. Follow these guidelines to improve your posture and body mechanics in your everyday activities. Standing  When standing, keep your spine neutral and your feet about hip width apart. Keep a slight bend in your knees. Your ears, shoulders, and hips should line up. When you do a task in which you stand in one place for a long time, place one foot up on a stable object that is 2-4 inches (5-10 cm) high, such as a footstool. This helps keep your spine neutral. Sitting  When sitting, keep your spine neutral and keep your feet flat on the floor. Use a footrest, if necessary, and keep your thighs parallel to the floor. Avoid rounding your shoulders, and avoid tilting your head forward. When working at a desk or a computer, keep your desk at a height where your hands are slightly lower than your elbows. Slide your chair under your desk so you are close enough to maintain good posture. When working at a computer, place your monitor at a height where you are looking straight ahead and you do not have to tilt your head forward or downward to look at the screen. Resting  When lying down and resting, avoid positions that are most painful for you. If you have pain with activities such as sitting, bending, stooping, or squatting, lie in a position in which  your body does not bend very much. For example, avoid curling up on your side with your arms and knees near your chest (fetal position). If you have pain with activities such as standing for a long time or reaching with your arms, lie with your spine in a neutral position and bend your knees slightly. Try the following positions: Lying on your side with a pillow between your knees. Lying on your back with a pillow under your knees. Lifting  When lifting objects, keep your feet at least shoulder width apart and tighten your abdominal muscles. Bend your knees and hips and keep your spine neutral. It is important to lift using the strength of your legs, not your back. Do not lock your knees straight out. Always ask for help to lift heavy or awkward objects. This information is not intended to replace advice given to you by your health care provider. Make sure you discuss any questions you have with your health care provider. Document Revised: 06/16/2021 Document Reviewed: 06/16/2021 Elsevier Patient Education  [place]. Sciatica  Sciatica is pain,  weakness, tingling, or loss of feeling (numbness) along the sciatic nerve. The sciatic nerve starts in the lower back and goes down the back of each leg. Sciatica usually affects one side of the body. Sciatica usually goes away on its own or with treatment. Sometimes, sciatica may come back. What are the causes? This condition happens when the sciatic nerve is pinched or has pressure put on it. This may be caused by: A disk in between the bones of the spine bulging out too far (herniated disk). Changes in the spinal disks due to aging. A condition that affects a muscle in the butt. Extra bone growth near the sciatic nerve. A break (fracture) of the area between your hip bones (pelvis). Pregnancy. Tumor. This is rare. What increases the risk? You are more likely to develop this condition if you: Play sports that put pressure or stress on  the spine. Have poor strength and ease of movement (flexibility). Have had a back injury or back surgery. Sit for long periods of time. Do activities that involve bending or lifting over and over again. Are very overweight (obese). What are the signs or symptoms? Symptoms can vary from mild to very bad. They may include: Any of these problems in the lower back, leg, hip, or butt: Mild tingling, loss of feeling, or dull aches. A burning feeling. Sharp pains. Loss of feeling in the back of the calf or the sole of the foot. Leg weakness. Very bad back pain that makes it hard to move. These symptoms may get worse when you cough, sneeze, or laugh. They may also get worse when you sit or stand for long periods of time. How is this treated? This condition often gets better without any treatment. However, treatment may include: Changing or cutting back on physical activity when you have pain. Exercising, including strengthening and stretching. Putting ice or heat on the affected area. Shots of medicines to relieve pain and swelling or to relax your muscles. Surgery. Follow these instructions at home: Medicines Take over-the-counter and prescription medicines only as told by your doctor. Ask your doctor if you should avoid driving or using machines while you are taking your medicine. Managing pain     If told, put ice on the affected area. To do this: Put ice in a plastic bag. Place a towel between your skin and the bag. Leave the ice on for 20 minutes, 2-3 times a day. If your skin turns bright red, take off the ice right away to prevent skin damage. The risk of skin damage is higher if you cannot feel pain, heat, or cold. If told, put heat on the affected area. Do this as often as told by your doctor. Use the heat source that your doctor tells you to use, such as a moist heat pack or a heating pad. Place a towel between your skin and the heat source. Leave the heat on for 20-30  minutes. If your skin turns bright red, take off the heat right away to prevent burns. The risk of burns is higher if you cannot feel pain, heat, or cold. Activity  Return to your normal activities when your doctor says that it is safe. Avoid activities that make your symptoms worse. Take short rests during the day. When you rest for a long time, do some physical activity or stretching between periods of rest. Avoid sitting for a long time without moving. Get up and move around at least one time each hour. Do exercises and  stretches as told by your doctor. Do not lift anything that is heavier than 10 lb (4.5 kg). Avoid lifting heavy things even when you do not have symptoms. Avoid lifting heavy things over and over. When you lift objects, always lift in a way that is safe for your body. To do this, you should: Bend your knees. Keep the object close to your body. Avoid twisting. General instructions Stay at a healthy weight. Wear comfortable shoes that support your feet. Avoid wearing high heels. Avoid sleeping on a mattress that is too soft or too hard. You might have less pain if you sleep on a mattress that is firm enough to support your back. Contact a doctor if: Your pain is not controlled by medicine. Your pain does not get better. Your pain gets worse. Your pain lasts longer than 4 weeks. You lose weight without trying. Get help right away if: You cannot control when you pee (urinate) or poop (have a bowel movement). You have weakness in any of these areas and it gets worse: Lower back. The area between your hip bones. Butt. Legs. You have redness or swelling of your back. You have a burning feeling when you pee. Summary Sciatica is pain, weakness, tingling, or loss of feeling (numbness) along the sciatic nerve. This may include the lower back, legs, hips, and butt. This condition happens when the sciatic nerve is pinched or has pressure put on it. Treatment often  includes rest, exercise, medicines, and putting ice or heat on the affected area. This information is not intended to replace advice given to you by your health care provider. Make sure you discuss any questions you have with your health care provider. Document Revised: 06/15/2021 Document Reviewed: 06/15/2021 Elsevier Patient Education  [place].

## 2022-01-26 NOTE — Progress Notes (Signed)
I,[name],acting as a Neurosurgeon for Arnette Felts, FNP.,have documented all relevant documentation on the behalf of Arnette Felts, FNP,as directed by  Arnette Felts, FNP while in the presence of Arnette Felts, FNP.    Subjective:     Patient ID: Glenn Carrillo , male    DOB: 1966/03/07 , 56 y.o.   MRN: [id number]   Chief Complaint  Patient presents with   Nerve Pain    HPI  Patient presents today with nerve pain that radiates from his lower back on the right side down his right leg.   Also causes pain to his right knee.  Initially noticed this past Friday.   He has tried naproxen and cyclobenzaprine which have not been helpful.   Patient adds the pain worsens when he stands. He thinks he may have injured while at work. Reports numbness to right outer thigh.      Past Medical History:  Diagnosis Date   Allergies    Allergy    Elevated cholesterol    Headache    Hypertension      Family History  Problem Relation Age of Onset   Colon polyps Mother    Dementia Mother    Prostate cancer Father    Diabetes Paternal Grandfather    Colon cancer Neg Hx    Esophageal cancer Neg Hx    Pancreatic cancer Neg Hx    Stomach cancer Neg Hx      Current Outpatient Medications:    albuterol (VENTOLIN HFA) 108 (90 Base) MCG/ACT inhaler, Inhale 2 puffs into the lungs every 6 (six) hours as needed for wheezing or shortness of breath., Disp: 8 g, Rfl: 2   amLODipine (NORVASC) 10 MG tablet, TAKE 1 TABLET BY MOUTH EVERY DAY, Disp: 90 tablet, Rfl: 1   atorvastatin (LIPITOR) 10 MG tablet, Take 1 tablet (10 mg total) by mouth daily., Disp: 90 tablet, Rfl: 1   benzonatate (TESSALON PERLES) 100 MG capsule, Take 1 capsule (100 mg total) by mouth every 6 (six) hours as needed. (Patient not taking: Reported on 01/31/2022), Disp: 30 capsule, Rfl: 1   cetirizine (ZYRTEC) 10 MG tablet, Take 1 tablet (10 mg total) by mouth daily as needed for allergies., Disp: 90 tablet, Rfl: 1    Chlorphen-PE-Acetaminophen (NOREL AD) 4-10-325 MG TABS, Take one tablet by mouth twice daily as needed for congestion. (Patient not taking: Reported on 01/31/2022), Disp: 84 tablet, Rfl: 0   cyclobenzaprine (FLEXERIL) 10 MG tablet, Take 1 tablet (10 mg total) by mouth 3 (three) times daily as needed for muscle spasms., Disp: 30 tablet, Rfl: 0   meloxicam (MOBIC) 15 MG tablet, Take 1 tablet (15 mg total) by mouth daily. For 5 days then take as needed daily, Disp: 30 tablet, Rfl: 0   Olopatadine HCl 0.7 % SOLN, Apply 1 drop to eye daily as needed., Disp: , Rfl:    sildenafil (VIAGRA) 50 MG tablet, Take 1 tablet (50 mg total) by mouth as needed for erectile dysfunction., Disp: 20 tablet, Rfl: 0   fluticasone (FLONASE) 50 MCG/ACT nasal spray, Place 2 sprays into both nostrils daily. (Patient not taking: Reported on 01/26/2022), Disp: 16 g, Rfl: 0   ibuprofen (ADVIL) 600 MG tablet, Take 1 tablet (600 mg total) by mouth every 6 (six) hours as needed. (Patient not taking: Reported on 12/14/2021), Disp: 30 tablet, Rfl: 0   ipratropium (ATROVENT) 0.06 % nasal spray, Place 2 sprays into the nose 3 (three) times daily as needed for rhinitis. (Patient not  taking: Reported on 10/08/2021), Disp: 15 mL, Rfl: 2   levocetirizine (XYZAL) 5 MG tablet, TAKE ONE TABLET BY MOUTH ONE TIME DAILY IN THE EVENING (Patient not taking: Reported on 10/08/2021), Disp: 90 tablet, Rfl: 1   mometasone (ELOCON) 0.1 % cream, Apply 1 application topically daily., Disp: 45 g, Rfl: 0   predniSONE (DELTASONE) 20 MG tablet, Take 2 tablets (40 mg total) by mouth daily., Disp: 10 tablet, Rfl: 0   tizanidine (ZANAFLEX) 2 MG capsule, Take 1 capsule (2 mg total) by mouth 3 (three) times daily. (Patient not taking: Reported on 10/08/2021), Disp: 21 capsule, Rfl: 0   Allergies  Allergen Reactions   Chlorhexidine Itching   Prednisone     ACHY JOINTS     Review of Systems  Constitutional: Negative.   HENT: Negative.    Cardiovascular: Negative.    Genitourinary: Negative.   Skin: Negative.   Allergic/Immunologic: Negative.   Hematological: Negative.   Psychiatric/Behavioral: Negative.       Today's Vitals   01/26/22 1449  BP: 122/84  Pulse: 75  Temp: 98 F (36.7 C)  SpO2: 98%  Weight: 230 lb 3.2 oz (104.4 kg)  Height: 5\' 8"  (1.727 m)   Body mass index is 35 kg/m.  Wt Readings from Last 3 Encounters:  01/26/22 230 lb 3.2 oz (104.4 kg)  12/14/21 225 lb (102.1 kg)  10/08/21 225 lb 6 oz (102.2 kg)    Objective:  Physical Exam Vitals reviewed.  Constitutional:      General: He is not in acute distress.    Appearance: Normal appearance.  Cardiovascular:     Rate and Rhythm: Normal rate and regular rhythm.     Pulses: Normal pulses.     Heart sounds: Normal heart sounds. No murmur heard. Pulmonary:     Effort: Pulmonary effort is normal. No respiratory distress.     Breath sounds: Normal breath sounds. No wheezing.  Neurological:     General: No focal deficit present.     Mental Status: He is alert and oriented to person, place, and time.     Cranial Nerves: No cranial nerve deficit.     Motor: No weakness.         Assessment And Plan:     1. Acute right-sided low back pain with right-sided sciatica Comments: Will treat with meloxicam and encouraged to stretch regularly. Also treated wtih toradol injection. Negative straight leg raise - meloxicam (MOBIC) 15 MG tablet; Take 1 tablet (15 mg total) by mouth daily. For 5 days then take as needed daily  Dispense: 30 tablet; Refill: 0 - ketorolac (TORADOL) injection 60 mg  2. Need for influenza vaccination Influenza vaccine administered Encouraged to take Tylenol as needed for fever or muscle aches. - Flu Vaccine QUAD 6+ mos PF IM (Fluarix Quad PF)  3. Class 2 obesity due to excess calories without serious comorbidity with body mass index (BMI) of 35.0 to 35.9 in adult He is encouraged to strive for BMI less than 30 to decrease cardiac risk. Advised to aim for  at least 150 minutes of exercise per week.     Patient was given opportunity to ask questions. Patient verbalized understanding of the plan and was able to repeat key elements of the plan. All questions were answered to their satisfaction.  [name], FNP   I, [name], FNP, have reviewed all documentation for this visit. The documentation on 01/26/22 for the exam, diagnosis, procedures, and orders are all accurate and complete.  IF YOU HAVE BEEN REFERRED TO A SPECIALIST, IT MAY TAKE 1-2 WEEKS TO SCHEDULE/PROCESS THE REFERRAL. IF YOU HAVE NOT HEARD FROM US/SPECIALIST IN TWO WEEKS, PLEASE GIVE [place] A CALL AT [phone].   THE PATIENT IS ENCOURAGED TO PRACTICE SOCIAL DISTANCING DUE TO THE COVID-19 PANDEMIC.

## 2022-01-31 ENCOUNTER — Ambulatory Visit (HOSPITAL_COMMUNITY)
Admission: EM | Admit: 2022-01-31 | Discharge: 2022-01-31 | Disposition: A | Discharge status: Home / Self Care | Payer: Commercial Managed Care - PPO | Attending: Emergency Medicine | Admitting: Emergency Medicine

## 2022-01-31 ENCOUNTER — Other Ambulatory Visit: Payer: Self-pay

## 2022-01-31 ENCOUNTER — Encounter (HOSPITAL_COMMUNITY): Payer: Self-pay | Admitting: Emergency Medicine

## 2022-01-31 DIAGNOSIS — M545 Low back pain, unspecified: Secondary | ICD-10-CM

## 2022-01-31 MED ORDER — PREDNISONE 20 MG PO TABS: 40.0000 mg | ORAL_TABLET | Freq: Every day | ORAL | 0 refills | Status: DC

## 2022-01-31 MED ORDER — METHYLPREDNISOLONE SODIUM SUCC 125 MG IJ SOLR
INTRAMUSCULAR | Status: AC
  Filled 2022-01-31: qty 2

## 2022-01-31 MED ORDER — METHYLPREDNISOLONE SODIUM SUCC 125 MG IJ SOLR
80.0000 mg | Freq: Once | INTRAMUSCULAR | Status: AC
  Administered 2022-01-31: 80 mg via INTRAMUSCULAR
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
## 2022-01-31 NOTE — Discharge Instructions (Signed)
Your pain is most likely caused by irritation to the muscles is compressing the nerve causing it to radiate down your leg.  Have been given and njection of steroids here today in the office which will help to reduce inflammation  Starting tomorrow take prednisone every morning with food for the next 5 days, if you do not like the way the medicine makes you feel, stop use and please notify your primary doctor for reevaluation of your symptoms  You may use heating pad in 15 minute intervals as needed for additional comfort, or you may find comfort in using ice in 10-15 minutes over affected area  Begin stretching affected area daily for 10 minutes as tolerated to further loosen muscles   When lying down place pillow underneath and between knees for support  Can try sleeping without pillow on firm mattress   Practice good posture: head back, shoulders back, chest forward, pelvis back and weight distributed evenly on both legs  If pain persist after recommended treatment or reoccurs if may be beneficial to follow up with orthopedic specialist for evaluation, this doctor specializes in the bones and can manage your symptoms long-term with options such as but not limited to imaging, medications or physical therapy

## 2022-01-31 NOTE — ED Triage Notes (Signed)
Right lower back pain for one week, radiating down right leg.  Only feels this pain when he stands up.  , pulling feeling in anterior thigh.    Has taken meloxicam.    No history of this pain, denies any injury

## 2022-02-01 ENCOUNTER — Ambulatory Visit: Payer: Commercial Managed Care - PPO | Admitting: Nurse Practitioner

## 2022-02-09 ENCOUNTER — Encounter: Payer: Commercial Managed Care - PPO | Admitting: Nurse Practitioner

## 2022-02-09 VITALS — BP 128/70 | HR 78 | Temp 98.4°F

## 2022-02-09 DIAGNOSIS — Z23 Encounter for immunization: Secondary | ICD-10-CM

## 2022-02-09 NOTE — Progress Notes (Unsigned)
Patient presents today for first shingles vaccine.   I have provided oversight concerning  ,CMA evaluation and treatment of this patient's health issues addressed during today's encounter. I agree with the assessment and plan as outlined in the note above.   Arnette Felts, DNP, FNP-BC [facility]

## 2022-02-09 NOTE — Patient Instructions (Signed)
Zoster Vaccine Injection What is this medication? ZOSTER VACCINE (ZOS ter vak SEEN) reduces the risk of herpes zoster (shingles). It does not treat shingles. It is still possible to get shingles after receiving the vaccine, but the symptoms may be less severe or not last as long. It works by helping your immune system learn how to fight off a future infection. This medicine may be used for other purposes; ask your health care provider or pharmacist if you have questions. COMMON BRAND NAME(S): SHINGRIX What should I tell my care team before I take this medication? They need to know if you have any of these conditions: Cancer Immune system problems An unusual or allergic reaction to Zoster vaccine, other medications, foods, dyes, or preservatives Pregnant or trying to get pregnant Breastfeeding How should I use this medication? This vaccine is injected into a muscle. It is given by your care team. This vaccine requires 2 doses to get the full benefit. Set a reminder for when your next dose is due. A copy of Vaccine Information Statements will be given before each vaccination. Be sure to read this information carefully each time. This sheet may change often. Talk to your care team about the use of this vaccine in children. This vaccine is not approved for use in children. Overdosage: If you think you have taken too much of this medicine contact a poison control center or emergency room at once. NOTE: This medicine is only for you. Do not share this medicine with others. What if I miss a dose? Keep appointments for follow-up (booster) doses. It is important not to miss your dose. Call your care team if you are unable to keep an appointment. What may interact with this medication? Medications that suppress your immune system Medications to treat cancer Steroid medications, such as prednisone or cortisone This list may not describe all possible interactions. Give your health care provider a list  of all the medicines, herbs, non-prescription drugs, or dietary supplements you use. Also tell them if you smoke, drink alcohol, or use illegal drugs. Some items may interact with your medicine. What should I watch for while using this medication? Visit your care team regularly. This vaccine, like all vaccines, may not fully protect everyone. What side effects may I notice from receiving this medication? Side effects that you should report to your care team as soon as possible: Allergic reactions--skin rash, itching, hives, swelling of the face, lips, tongue, or throat Side effects that usually do not require medical attention (report these to your care team if they continue or are bothersome): Chills Fatigue Feeling faint or lightheaded Fever Headache Muscle pain Pain, redness, or irritation at injection site This list may not describe all possible side effects. Call your doctor for medical advice about side effects. You may report side effects to FDA at 1-800-FDA-1088. Where should I keep my medication? This vaccine is only given by your care team. It will not be stored at home. NOTE: This sheet is a summary. It may not cover all possible information. If you have questions about this medicine, talk to your doctor, pharmacist, or health care provider.  2023 Elsevier/Gold Standard (2021-08-19 00:00:00)  

## 2022-03-04 ENCOUNTER — Other Ambulatory Visit: Payer: Self-pay | Admitting: Nurse Practitioner

## 2022-03-04 DIAGNOSIS — I10 Essential (primary) hypertension: Secondary | ICD-10-CM

## 2022-03-16 ENCOUNTER — Encounter (HOSPITAL_COMMUNITY): Payer: Self-pay

## 2022-03-16 ENCOUNTER — Ambulatory Visit (HOSPITAL_COMMUNITY)
Admission: EM | Admit: 2022-03-16 | Discharge: 2022-03-16 | Disposition: A | Discharge status: Home / Self Care | Payer: Commercial Managed Care - PPO | Attending: Emergency Medicine | Admitting: Emergency Medicine

## 2022-03-16 DIAGNOSIS — J329 Chronic sinusitis, unspecified: Secondary | ICD-10-CM | POA: Diagnosis not present

## 2022-03-16 DIAGNOSIS — J069 Acute upper respiratory infection, unspecified: Secondary | ICD-10-CM

## 2022-03-16 DIAGNOSIS — B9789 Other viral agents as the cause of diseases classified elsewhere: Secondary | ICD-10-CM

## 2022-03-16 MED ORDER — GUAIFENESIN ER 600 MG PO TB12: 600.0000 mg | ORAL_TABLET | Freq: Two times a day (BID) | ORAL | 0 refills | Status: AC
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
## 2022-03-16 NOTE — ED Triage Notes (Signed)
Pt is here for right ear pain x few days, nasal , nausea congestion, runny nose , headache , low in energy ,  and facial painx 1wk

## 2022-03-16 NOTE — Discharge Instructions (Addendum)
Continue daily zyrtec with daily nasal spray Add the mucinex twice daily for the next 5 days. Take this with a lot of fluids and water. You can continue the Delsym as well.  Use a humidifer at night. Prop yourself up to allow for drainage. You can take benadryl at night time for congestion relief and to help you sleep.  Symptoms may last several more days but will start to improve. Please return if there is no improvement over the next 5 days

## 2022-03-28 ENCOUNTER — Other Ambulatory Visit: Payer: Self-pay | Admitting: Nurse Practitioner

## 2022-03-28 DIAGNOSIS — M5441 Lumbago with sciatica, right side: Secondary | ICD-10-CM

## 2022-04-08 ENCOUNTER — Encounter: Payer: Self-pay | Admitting: Nurse Practitioner

## 2022-04-08 ENCOUNTER — Ambulatory Visit: Payer: Commercial Managed Care - PPO | Admitting: Nurse Practitioner

## 2022-04-08 VITALS — BP 148/88 | HR 77 | Temp 98.5°F | Ht 68.0 in | Wt 235.0 lb

## 2022-04-08 DIAGNOSIS — I10 Essential (primary) hypertension: Secondary | ICD-10-CM

## 2022-04-08 DIAGNOSIS — J0111 Acute recurrent frontal sinusitis: Secondary | ICD-10-CM | POA: Diagnosis not present

## 2022-04-08 DIAGNOSIS — E782 Mixed hyperlipidemia: Secondary | ICD-10-CM

## 2022-04-08 DIAGNOSIS — R7303 Prediabetes: Secondary | ICD-10-CM | POA: Diagnosis not present

## 2022-04-08 DIAGNOSIS — N528 Other male erectile dysfunction: Secondary | ICD-10-CM

## 2022-04-08 MED ORDER — AZELASTINE-FLUTICASONE 137-50 MCG/ACT NA SUSP: 2.0000 | Freq: Two times a day (BID) | NASAL | 2 refills | Status: DC

## 2022-04-08 MED ORDER — SILDENAFIL CITRATE 50 MG PO TABS: 50.0000 mg | ORAL_TABLET | ORAL | 2 refills | Status: DC | PRN

## 2022-04-08 MED ORDER — AMOXICILLIN-POT CLAVULANATE 875-125 MG PO TABS: 1.0000 | ORAL_TABLET | Freq: Two times a day (BID) | ORAL | 0 refills | Status: DC

## 2022-04-08 MED ORDER — ATORVASTATIN CALCIUM 10 MG PO TABS: 10.0000 mg | ORAL_TABLET | Freq: Every day | ORAL | 1 refills | Status: DC

## 2022-04-08 NOTE — Patient Instructions (Addendum)
Hypertension, Adult High blood pressure (hypertension) is when the force of blood pumping through the arteries is too strong. The arteries are the blood vessels that carry blood from the heart throughout the body. Hypertension forces the heart to work harder to pump blood and may cause arteries to become narrow or stiff. Untreated or uncontrolled hypertension can lead to a heart attack, heart failure, a stroke, kidney disease, and other problems. A blood pressure reading consists of a higher number over a lower number. Ideally, your blood pressure should be below 120/80. The first ("top") number is called the systolic pressure. It is a measure of the pressure in your arteries as your heart beats. The second ("bottom") number is called the diastolic pressure. It is a measure of the pressure in your arteries as the heart relaxes. What are the causes? The exact cause of this condition is not known. There are some conditions that result in high blood pressure. What increases the risk? Certain factors may make you more likely to develop high blood pressure. Some of these risk factors are under your control, including: Smoking. Not getting enough exercise or physical activity. Being overweight. Having too much fat, sugar, calories, or salt (sodium) in your diet. Drinking too much alcohol. Other risk factors include: Having a personal history of heart disease, diabetes, high cholesterol, or kidney disease. Stress. Having a family history of high blood pressure and high cholesterol. Having obstructive sleep apnea. Age. The risk increases with age. What are the signs or symptoms? High blood pressure may not cause symptoms. Very high blood pressure (hypertensive crisis) may cause: Headache. Fast or irregular heartbeats (palpitations). Shortness of breath. Nosebleed. Nausea and vomiting. Vision changes. Severe chest pain, dizziness, and seizures. How is this diagnosed? This condition is diagnosed by  measuring your blood pressure while you are seated, with your arm resting on a flat surface, your legs uncrossed, and your feet flat on the floor. The cuff of the blood pressure monitor will be placed directly against the skin of your upper arm at the level of your heart. Blood pressure should be measured at least twice using the same arm. Certain conditions can cause a difference in blood pressure between your right and left arms. If you have a high blood pressure reading during one visit or you have normal blood pressure with other risk factors, you may be asked to: Return on a different day to have your blood pressure checked again. Monitor your blood pressure at home for 1 week or longer. If you are diagnosed with hypertension, you may have other blood or imaging tests to help your health care provider understand your overall risk for other conditions. How is this treated? This condition is treated by making healthy lifestyle changes, such as eating healthy foods, exercising more, and reducing your alcohol intake. You may be referred for counseling on a healthy diet and physical activity. Your health care provider may prescribe medicine if lifestyle changes are not enough to get your blood pressure under control and if: Your systolic blood pressure is above 130. Your diastolic blood pressure is above 80. Your personal target blood pressure may vary depending on your medical conditions, your age, and other factors. Follow these instructions at home: Eating and drinking  Eat a diet that is high in fiber and potassium, and low in sodium, added sugar, and fat. An example of this eating plan is called the DASH diet. DASH stands for Dietary Approaches to Stop Hypertension. To eat this way: Eat   plenty of fresh fruits and vegetables. Try to fill one half of your plate at each meal with fruits and vegetables. Eat whole grains, such as whole-wheat pasta, brown rice, or whole-grain bread. Fill about one  fourth of your plate with whole grains. Eat or drink low-fat dairy products, such as skim milk or low-fat yogurt. Avoid fatty cuts of meat, processed or cured meats, and poultry with skin. Fill about one fourth of your plate with lean proteins, such as fish, chicken without skin, beans, eggs, or tofu. Avoid pre-made and processed foods. These tend to be higher in sodium, added sugar, and fat. Reduce your daily sodium intake. Many people with hypertension should eat less than 1,500 mg of sodium a day. Do not drink alcohol if: Your health care provider tells you not to drink. You are pregnant, may be pregnant, or are planning to become pregnant. If you drink alcohol: Limit how much you have to: 0-1 drink a day for women. 0-2 drinks a day for men. Know how much alcohol is in your drink. In the [place] one drink equals one 12 oz bottle of beer (355 mL), one 5 oz glass of wine (148 mL), or one 1 oz glass of hard liquor (44 mL). Lifestyle  Work with your health care provider to maintain a healthy body weight or to lose weight. Ask what an ideal weight is for you. Get at least 30 minutes of exercise that causes your heart to beat faster (aerobic exercise) most days of the week. Activities may include walking, swimming, or biking. Include exercise to strengthen your muscles (resistance exercise), such as Pilates or lifting weights, as part of your weekly exercise routine. Try to do these types of exercises for 30 minutes at least 3 days a week. Do not use any products that contain nicotine or tobacco. These products include cigarettes, chewing tobacco, and vaping devices, such as e-cigarettes. If you need help quitting, ask your health care provider. Monitor your blood pressure at home as told by your health care provider. Keep all follow-up visits. This is important. Medicines Take over-the-counter and prescription medicines only as told by your health care provider. Follow directions carefully. Blood  pressure medicines must be taken as prescribed. Do not skip doses of blood pressure medicine. Doing this puts you at risk for problems and can make the medicine less effective. Ask your health care provider about side effects or reactions to medicines that you should watch for. Contact a health care provider if you: Think you are having a reaction to a medicine you are taking. Have headaches that keep coming back (recurring). Feel dizzy. Have swelling in your ankles. Have trouble with your vision. Get help right away if you: Develop a severe headache or confusion. Have unusual weakness or numbness. Feel faint. Have severe pain in your chest or abdomen. Vomit repeatedly. Have trouble breathing. These symptoms may be an emergency. Get help right away. Call 911. Do not wait to see if the symptoms will go away. Do not drive yourself to the hospital. Summary Hypertension is when the force of blood pumping through your arteries is too strong. If this condition is not controlled, it may put you at risk for serious complications. Your personal target blood pressure may vary depending on your medical conditions, your age, and other factors. For most people, a normal blood pressure is less than 120/80. Hypertension is treated with lifestyle changes, medicines, or a combination of both. Lifestyle changes include losing weight, eating a healthy,   low-sodium diet, exercising more, and limiting alcohol. This information is not intended to replace advice given to you by your health care provider. Make sure you discuss any questions you have with your health care provider. Document Revised: 01/13/2021 Document Reviewed: 01/13/2021 Elsevier Patient Education  [place].  Sinus Infection, Adult A sinus infection, also called sinusitis, is inflammation of your sinuses. Sinuses are hollow spaces in the bones around your face. Your sinuses are located: Around your eyes. In the middle of your  forehead. Behind your nose. In your cheekbones. Mucus normally drains out of your sinuses. When your nasal tissues become inflamed or swollen, mucus can become trapped or blocked. This allows bacteria, viruses, and fungi to grow, which leads to infection. Most infections of the sinuses are caused by a virus. A sinus infection can develop quickly. It can last for up to 4 weeks (acute) or for more than 12 weeks (chronic). A sinus infection often develops after a cold. What are the causes? This condition is caused by anything that creates swelling in the sinuses or stops mucus from draining. This includes: Allergies. Asthma. Infection from bacteria or viruses. Deformities or blockages in your nose or sinuses. Abnormal growths in the nose (nasal polyps). Pollutants, such as chemicals or irritants in the air. Infection from fungi. This is rare. What increases the risk? You are more likely to develop this condition if you: Have a weak body defense system (immune system). Do a lot of swimming or diving. Overuse nasal sprays. Smoke. What are the signs or symptoms? The main symptoms of this condition are pain and a feeling of pressure around the affected sinuses. Other symptoms include: Stuffy nose or congestion that makes it difficult to breathe through your nose. Thick yellow or greenish drainage from your nose. Tenderness, swelling, and warmth over the affected sinuses. A cough that may get worse at night. Decreased sense of smell and taste. Extra mucus that collects in the throat or the back of the nose (postnasal drip) causing a sore throat or bad breath. Tiredness (fatigue). Fever. How is this diagnosed? This condition is diagnosed based on: Your symptoms. Your medical history. A physical exam. Tests to find out if your condition is acute or chronic. This may include: Checking your nose for nasal polyps. Viewing your sinuses using a device that has a light (endoscope). Testing for  allergies or bacteria. Imaging tests, such as an MRI or CT scan. In rare cases, a bone biopsy may be done to rule out more serious types of fungal sinus disease. How is this treated? Treatment for a sinus infection depends on the cause and whether your condition is chronic or acute. If caused by a virus, your symptoms should go away on their own within 10 days. You may be given medicines to relieve symptoms. They include: Medicines that shrink swollen nasal passages (decongestants). A spray that eases inflammation of the nostrils (topical intranasal corticosteroids). Rinses that help get rid of thick mucus in your nose (nasal saline washes). Medicines that treat allergies (antihistamines). Over-the-counter pain relievers. If caused by bacteria, your health care provider may recommend waiting to see if your symptoms improve. Most bacterial infections will get better without antibiotic medicine. You may be given antibiotics if you have: A severe infection. A weak immune system. If caused by narrow nasal passages or nasal polyps, surgery may be needed. Follow these instructions at home: Medicines Take, use, or apply over-the-counter and prescription medicines only as told by your health care provider.  These may include nasal sprays. If you were prescribed an antibiotic medicine, take it as told by your health care provider. Do not stop taking the antibiotic even if you start to feel better. Hydrate and humidify  Drink enough fluid to keep your urine pale yellow. Staying hydrated will help to thin your mucus. Use a cool mist humidifier to keep the humidity level in your home above 50%. Inhale steam for 10-15 minutes, 3-4 times a day, or as told by your health care provider. You can do this in the bathroom while a hot shower is running. Limit your exposure to cool or dry air. Rest Rest as much as possible. Sleep with your head raised (elevated). Make sure you get enough sleep each  night. General instructions  Apply a warm, moist washcloth to your face 3-4 times a day or as told by your health care provider. This will help with discomfort. Use nasal saline washes as often as told by your health care provider. Wash your hands often with soap and water to reduce your exposure to germs. If soap and water are not available, use hand sanitizer. Do not smoke. Avoid being around people who are smoking (secondhand smoke). Keep all follow-up visits. This is important. Contact a health care provider if: You have a fever. Your symptoms get worse. Your symptoms do not improve within 10 days. Get help right away if: You have a severe headache. You have persistent vomiting. You have severe pain or swelling around your face or eyes. You have vision problems. You develop confusion. Your neck is stiff. You have trouble breathing. These symptoms may be an emergency. Get help right away. Call 911. Do not wait to see if the symptoms will go away. Do not drive yourself to the hospital. Summary A sinus infection is soreness and inflammation of your sinuses. Sinuses are hollow spaces in the bones around your face. This condition is caused by nasal tissues that become inflamed or swollen. The swelling traps or blocks the flow of mucus. This allows bacteria, viruses, and fungi to grow, which leads to infection. If you were prescribed an antibiotic medicine, take it as told by your health care provider. Do not stop taking the antibiotic even if you start to feel better. Keep all follow-up visits. This is important. This information is not intended to replace advice given to you by your health care provider. Make sure you discuss any questions you have with your health care provider. Document Revised: 02/10/2021 Document Reviewed: 02/10/2021 Elsevier Patient Education  [place].  When you have cold symptoms you need to take HBP coricidan brand medications.

## 2022-04-08 NOTE — Progress Notes (Signed)
I,[name],acting as a Education administrator for [employer], FNP.,have documented all relevant documentation on the behalf of [name], FNP,as directed by  [name], FNP while in the presence of [name], [place].  Subjective:     Patient ID: Glenn Carrillo , male    DOB: 07-30-1965 , 57 y.o.   MRN: [id number]   Chief Complaint  Patient presents with   Hypertension    HPI  Patient presents today for a blood pressure f/u.   He is also here due to being sick since 12/26 - he was to take mucous release medications for 2 weeks and he feels like it has come back, he has yellow mucous with blood. He has nasal congestion and is using flonase. He has pain to his forehead and his face. He has taken [facility] allergy tablets. He took a norel this morning, also took his blood pressure medications.  Reports his symptoms got worse 3 weeks ago.   Hypertension This is a chronic problem. The current episode started more than 1 year ago. The problem has been gradually worsening since onset. The problem is uncontrolled. Pertinent negatives include no anxiety, headaches, palpitations or shortness of breath. Agents associated with hypertension include steroids. Risk factors for coronary artery disease include obesity and sedentary lifestyle. Past treatments include calcium channel blockers. The current treatment provides mild improvement. There is no history of angina or CAD/MI. There is no history of chronic renal disease.     Past Medical History:  Diagnosis Date   Allergies    Allergy    Elevated cholesterol    Headache    Hypertension      Family History  Problem Relation Age of Onset   Colon polyps Mother    Dementia Mother    Prostate cancer Father    Diabetes Paternal Grandfather    Colon cancer Neg Hx    Esophageal cancer Neg Hx    Pancreatic cancer Neg Hx    Stomach cancer Neg Hx      Current Outpatient Medications:    amoxicillin-clavulanate (AUGMENTIN) 875-125 MG tablet, Take 1 tablet by  mouth 2 (two) times daily., Disp: 20 tablet, Rfl: 0   Azelastine-Fluticasone 137-50 MCG/ACT SUSP, Place 2 sprays into the nose 2 (two) times daily., Disp: 23 g, Rfl: 2   albuterol (VENTOLIN HFA) 108 (90 Base) MCG/ACT inhaler, Inhale 2 puffs into the lungs every 6 (six) hours as needed for wheezing or shortness of breath., Disp: 8 g, Rfl: 2   amLODipine (NORVASC) 10 MG tablet, TAKE 1 TABLET BY MOUTH EVERY DAY, Disp: 90 tablet, Rfl: 1   atorvastatin (LIPITOR) 10 MG tablet, Take 1 tablet (10 mg total) by mouth daily., Disp: 90 tablet, Rfl: 1   cetirizine (ZYRTEC) 10 MG tablet, Take 1 tablet (10 mg total) by mouth daily as needed for allergies., Disp: 90 tablet, Rfl: 1   meloxicam (MOBIC) 15 MG tablet, TAKE 1 TABLET BY MOUTH DAILY FOR 5 DAYS, THEN TAKE DAILY ONLY AS NEEDED, Disp: 30 tablet, Rfl: 0   mometasone (ELOCON) 0.1 % cream, Apply 1 application topically daily., Disp: 45 g, Rfl: 0   sildenafil (VIAGRA) 50 MG tablet, Take 1 tablet (50 mg total) by mouth as needed for erectile dysfunction., Disp: 20 tablet, Rfl: 2   Allergies  Allergen Reactions   Chlorhexidine Itching   Prednisone     ACHY JOINTS     Review of Systems  Constitutional: Negative.   HENT:  Positive for congestion, rhinorrhea, sinus pressure, sinus  pain and sore throat.   Respiratory:  Positive for cough. Negative for shortness of breath.   Cardiovascular: Negative.  Negative for palpitations.  Gastrointestinal: Negative.   Neurological: Negative.  Negative for headaches.     Today's Vitals   04/08/22 1118  BP: (!) 148/88  Pulse: 77  Temp: 98.5 F (36.9 C)  TempSrc: Oral  Weight: 235 lb (106.6 kg)  Height: 5\' 8"  (1.727 m)   Body mass index is 35.73 kg/m.   Objective:  Physical Exam Vitals reviewed.  Constitutional:      General: He is not in acute distress.    Appearance: Normal appearance. He is obese.  HENT:     Head: Normocephalic.     Nose:     Right Turbinates: Swollen.     Left Turbinates:  Swollen.     Mouth/Throat:     Comments: Deferred - masked Cardiovascular:     Rate and Rhythm: Normal rate and regular rhythm.     Pulses: Normal pulses.     Heart sounds: Normal heart sounds. No murmur heard. Pulmonary:     Effort: Pulmonary effort is normal. No respiratory distress.     Breath sounds: Normal breath sounds. No wheezing.  Musculoskeletal:     Cervical back: Normal range of motion and neck supple.  Skin:    General: Skin is warm and dry.     Capillary Refill: Capillary refill takes less than 2 seconds.  Neurological:     General: No focal deficit present.     Mental Status: He is alert and oriented to person, place, and time.     Cranial Nerves: No cranial nerve deficit.     Motor: No weakness.  Psychiatric:        Mood and Affect: Mood normal.        Behavior: Behavior normal.        Thought Content: Thought content normal.        Judgment: Judgment normal.         Assessment And Plan:     1. Essential hypertension Comments: Blood pressure is elevated patient has been taking cold medication.  Advised to take [place] brand blood pressure meds when sick. Limit salt intake - BMP8+eGFR  2. Prediabetes Comments: Diet controlled.  Will check hemoglobin A1c - Hemoglobin A1c  3. Mixed hyperlipidemia Comments: Cholesterol slightly elevated, diet controlled. Continue low fat diet - atorvastatin (LIPITOR) 10 MG tablet; Take 1 tablet (10 mg total) by mouth daily.  Dispense: 90 tablet; Refill: 1 - Lipid panel  4. Acute recurrent frontal sinusitis Comments: Will treat with Augmentin symptoms have been ongoing since prior to Christmas.  Continues to frontal sinuses bilaterally - amoxicillin-clavulanate (AUGMENTIN) 875-125 MG tablet; Take 1 tablet by mouth 2 (two) times daily.  Dispense: 20 tablet; Refill: 0 - Azelastine-Fluticasone 137-50 MCG/ACT SUSP; Place 2 sprays into the nose 2 (two) times daily.  Dispense: 23 g; Refill: 2  5. Other male erectile  dysfunction - sildenafil (VIAGRA) 50 MG tablet; Take 1 tablet (50 mg total) by mouth as needed for erectile dysfunction.  Dispense: 20 tablet; Refill: 2     Patient was given opportunity to ask questions. Patient verbalized understanding of the plan and was able to repeat key elements of the plan. All questions were answered to their satisfaction.  10-[birth year]-1971, FNP    I, Arnette Felts, FNP, have reviewed all documentation for this visit. The documentation on 04/08/22 for the exam, diagnosis, procedures, and orders are all accurate  and complete.  IF YOU HAVE BEEN REFERRED TO A SPECIALIST, IT MAY TAKE 1-2 WEEKS TO SCHEDULE/PROCESS THE REFERRAL. IF YOU HAVE NOT HEARD FROM US/SPECIALIST IN TWO WEEKS, PLEASE GIVE [place] A CALL AT [phone] X [phone].   THE PATIENT IS ENCOURAGED TO PRACTICE SOCIAL DISTANCING DUE TO THE COVID-19 PANDEMIC.

## 2022-04-09 LAB — BMP8+EGFR
BUN/Creatinine Ratio: 10 (ref 9–20)
BUN: 11 mg/dL (ref 6–24)
CO2: 21 mmol/L (ref 20–29)
Calcium: 9.4 mg/dL (ref 8.7–10.2)
Chloride: 104 mmol/L (ref 96–106)
Creatinine, Ser: 1.15 mg/dL (ref 0.76–1.27)
Glucose: 100 mg/dL — ABNORMAL HIGH (ref 70–99)
Potassium: 4.4 mmol/L (ref 3.5–5.2)
Sodium: 140 mmol/L (ref 134–144)
eGFR: 74 mL/min/{1.73_m2} (ref >59)

## 2022-04-09 LAB — LIPID PANEL
Chol/HDL Ratio: 3.6 ratio (ref 0.0–5.0)
Cholesterol, Total: 169 mg/dL (ref 100–199)
HDL: 47 mg/dL (ref >39)
LDL Chol Calc (NIH): 91 mg/dL (ref 0–99)
Triglycerides: 181 mg/dL — ABNORMAL HIGH (ref 0–149)
VLDL Cholesterol Cal: 31 mg/dL (ref 5–40)

## 2022-04-09 LAB — HEMOGLOBIN A1C
Est. average glucose Bld gHb Est-mCnc: 137 mg/dL
Hgb A1c MFr Bld: 6.4 % — ABNORMAL HIGH (ref 4.8–5.6)

## 2022-05-05 ENCOUNTER — Other Ambulatory Visit: Payer: Self-pay | Admitting: Nurse Practitioner

## 2022-05-05 DIAGNOSIS — M5441 Lumbago with sciatica, right side: Secondary | ICD-10-CM

## 2022-06-21 ENCOUNTER — Other Ambulatory Visit: Payer: Self-pay | Admitting: Nurse Practitioner

## 2022-06-21 ENCOUNTER — Telehealth: Payer: Self-pay

## 2022-06-21 MED ORDER — METFORMIN HCL 500 MG PO TABS: 500.0000 mg | ORAL_TABLET | Freq: Every day | ORAL | 1 refills | Status: DC

## 2022-06-21 NOTE — Telephone Encounter (Signed)
Patient received letter regarding recent lab results. Patient would like to try Metformin.   Please send rx to pharmacy in chart. Thanks!

## 2022-09-17 IMAGING — US US SCROTUM
1 series · 13 of 25 positions shown · non-contrast
Comparison: July 10, 2007

CLINICAL DATA: Mass palpated to the left testicle.

EXAM:
SCROTAL ULTRASOUND
DOPPLER ULTRASOUND OF THE TESTICLES
TECHNIQUE: Complete ultrasound examination of the testicles, epididymis, and
other scrotal structures was performed. Color and spectral Doppler
ultrasound were also utilized to evaluate blood flow to the
testicles.

[Series 1: us scrotum · 0.06mm/px · 13 of 55 slices shown]
[im 1/55]
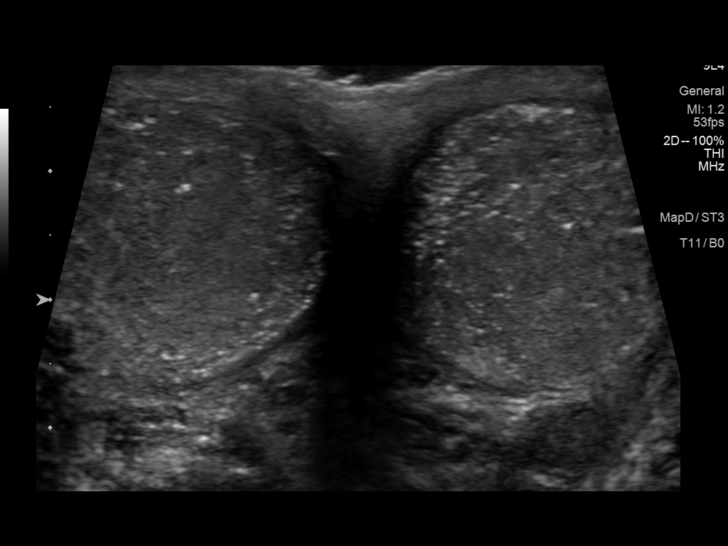
[im 5/55]
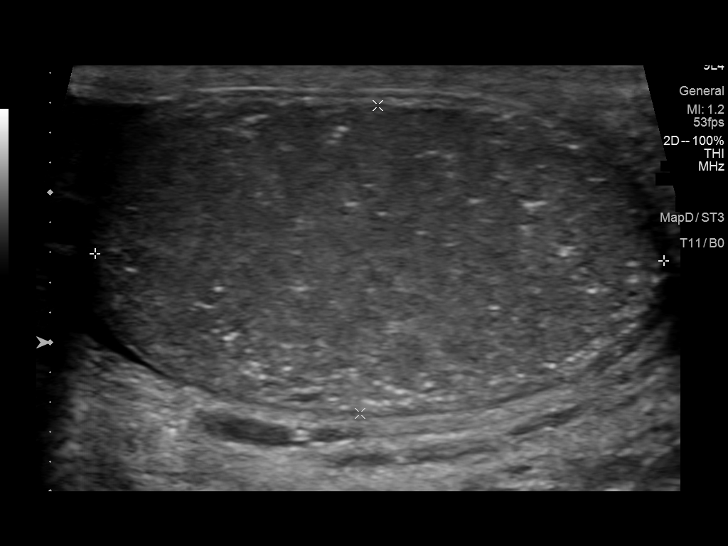
[im 10/55]
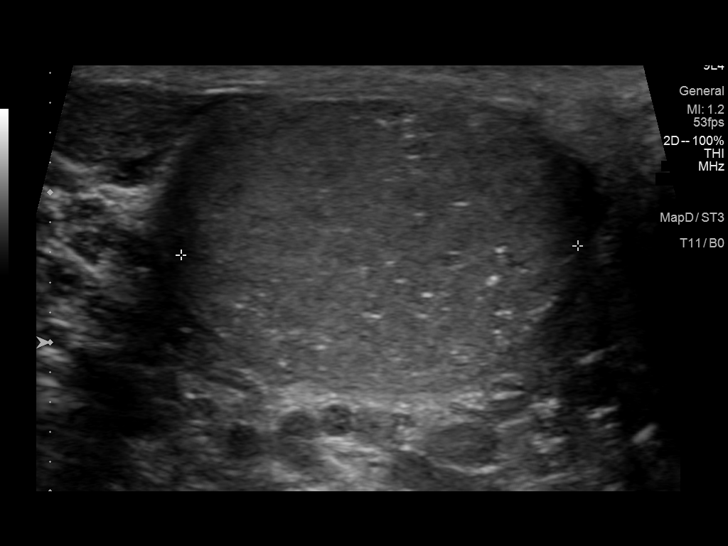
[im 14/55]
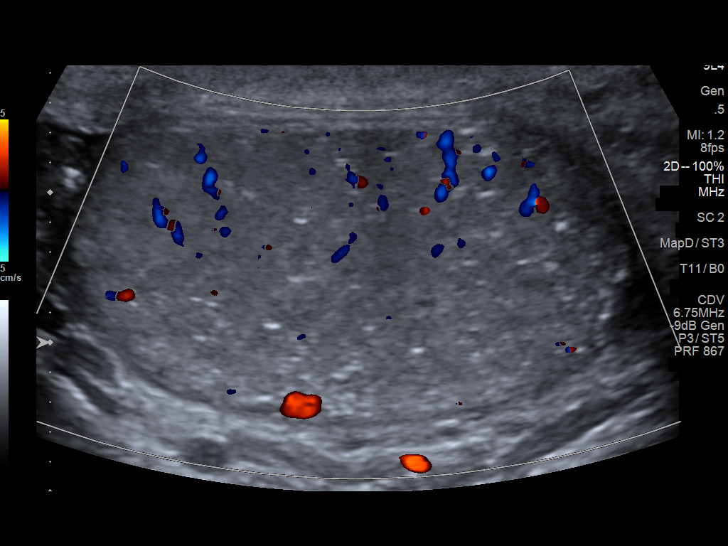
[im 19/55]
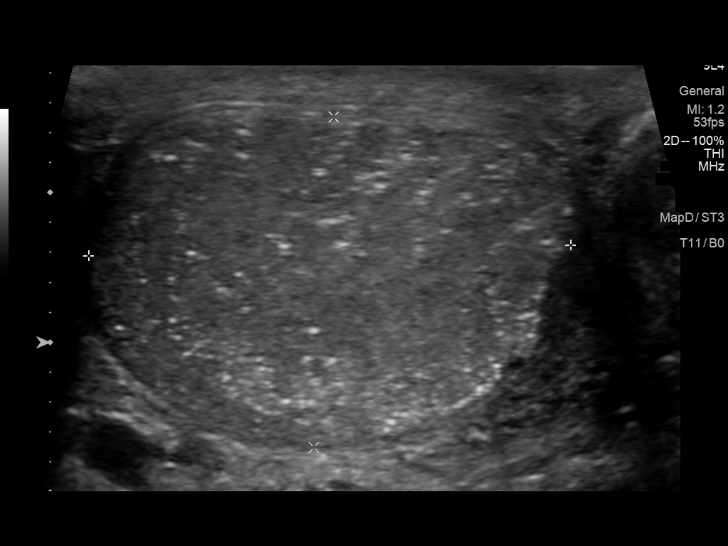
[im 23/55]
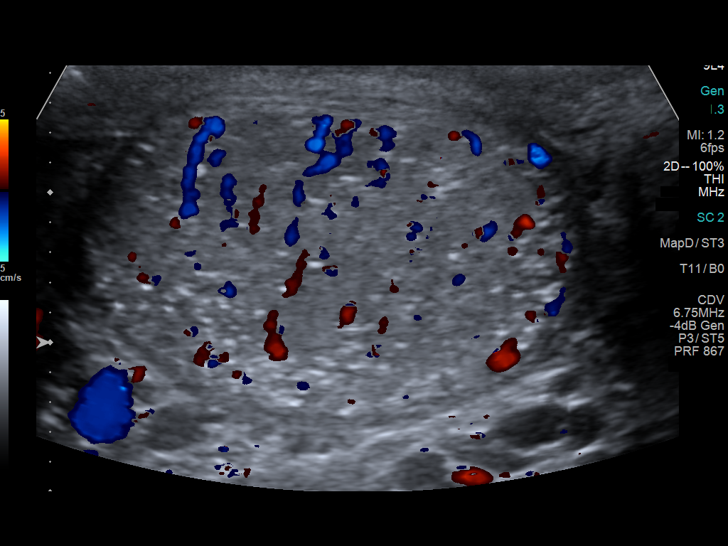
[im 28/55]
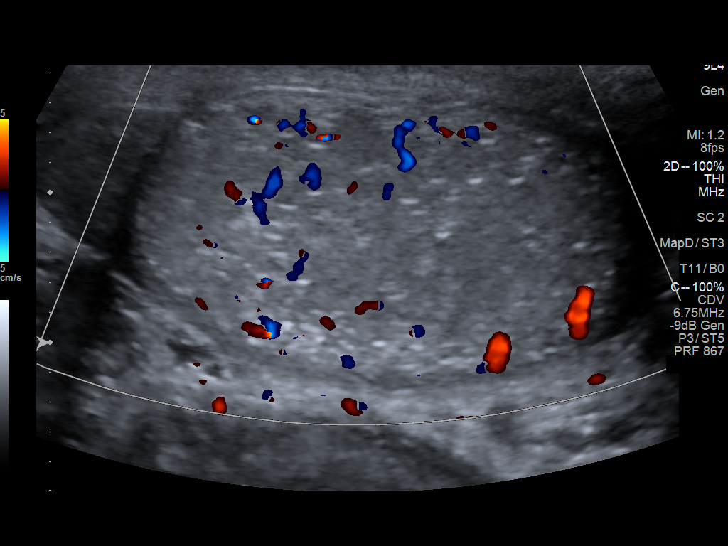
[im 32/55]
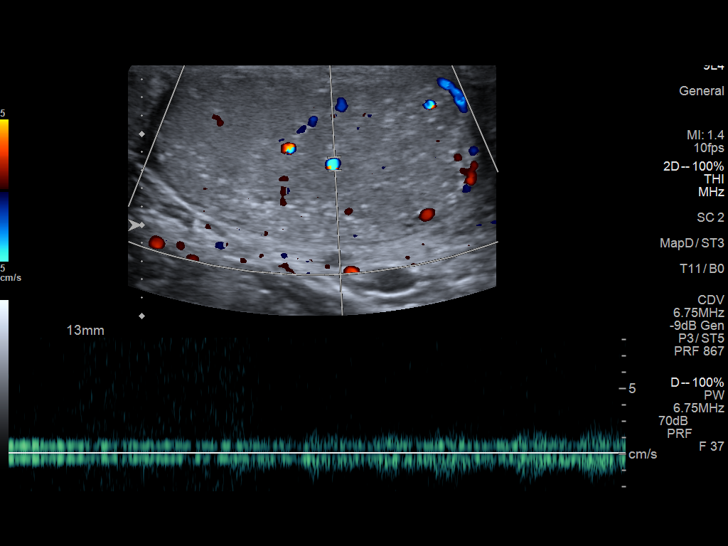
[im 37/55]
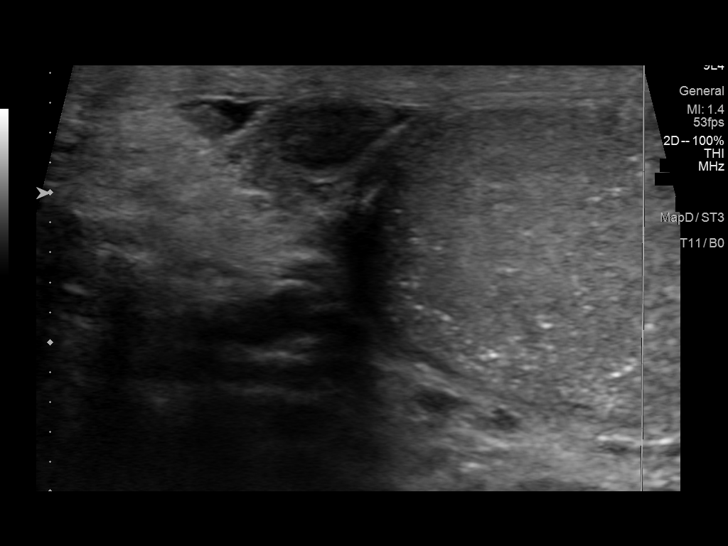
[im 41/55]
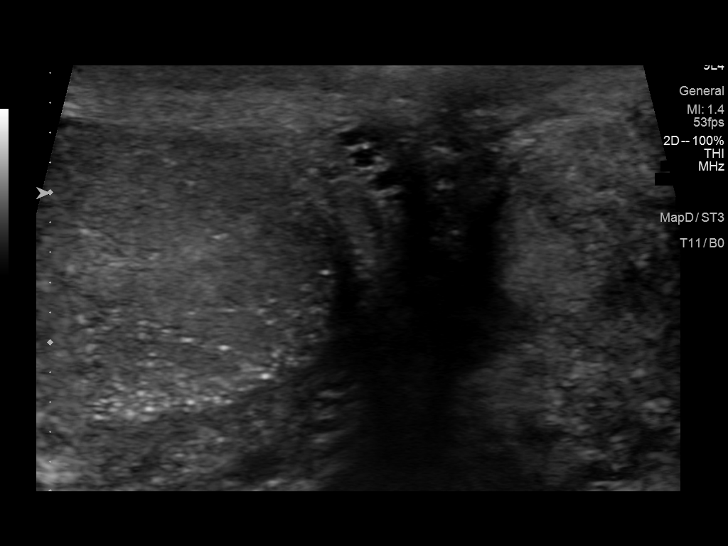
[im 46/55]
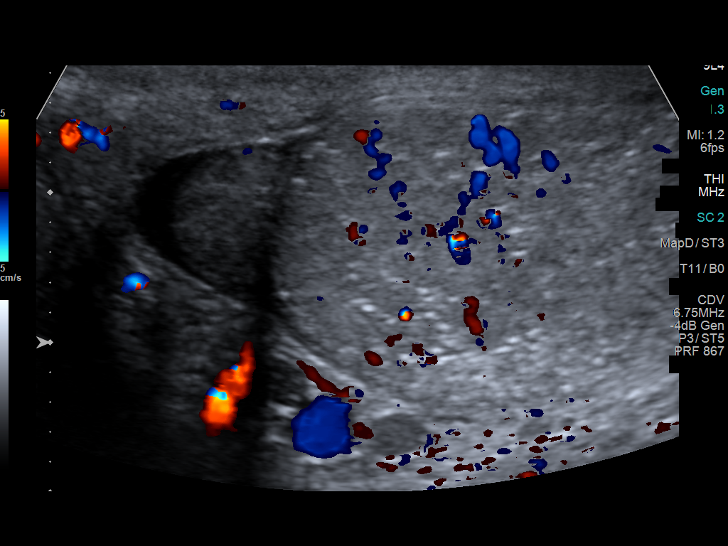
[im 50/55]
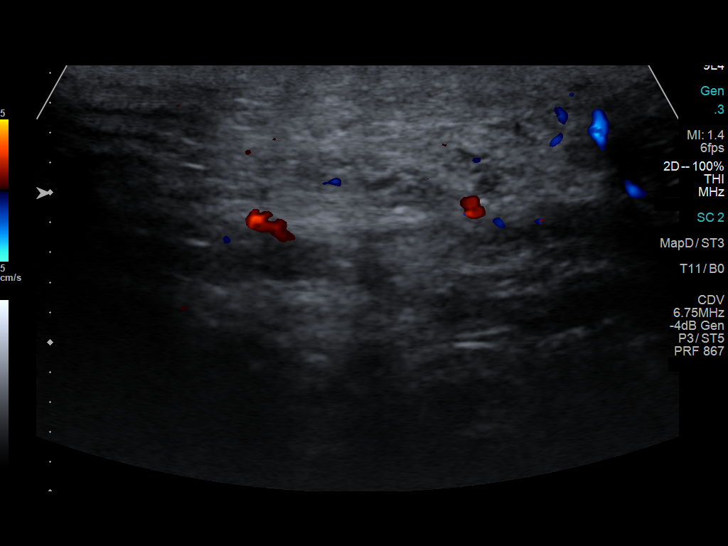
[im 55/55]
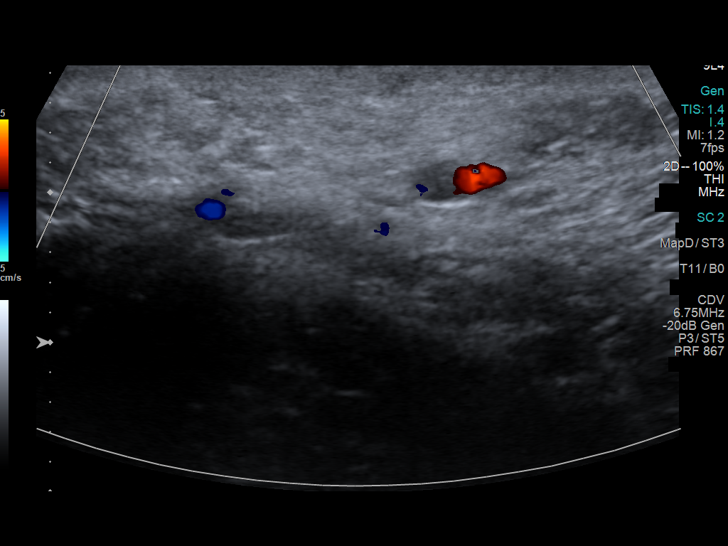

[13 of 25 positions shown; findings below may reference images not displayed]

FINDINGS: Right testicle

Measurements: 3.8 cm x 2.1 cm x 2.7 cm. Microlithiasis is seen. No
mass is visualized.

Left testicle

Measurements: 3.2 cm x 2.2 cm x 2.7 cm. Marker lithiasis is seen. No
mass is visualized.

Right epididymis: A 9 mm x 5 mm x 9 mm hypoechoic lesion is seen
within the right epididymal head. No abnormal flow is seen within
this region on color Doppler evaluation.

Left epididymis:  Normal in size and appearance.

Hydrocele:  None visualized.

Varicocele:  None visualized.

Pulsed Doppler interrogation of both testes demonstrates normal low
resistance arterial and venous waveforms bilaterally.
IMPRESSION: 1. Bilateral testicular microlithiasis.
2. Very small, likely benign hypoechoic lesion within the right
epididymal head. Correlation with 3-6 month follow-up testicular
ultrasound is recommended to determine stability.

## 2022-09-23 IMAGING — CR DG CHEST 2V
2 series · 2 of 2 positions shown · non-contrast
Comparison: None Available.

CLINICAL DATA: Persistent cough.

EXAM:
CHEST - 2 VIEW

[w chest pa]
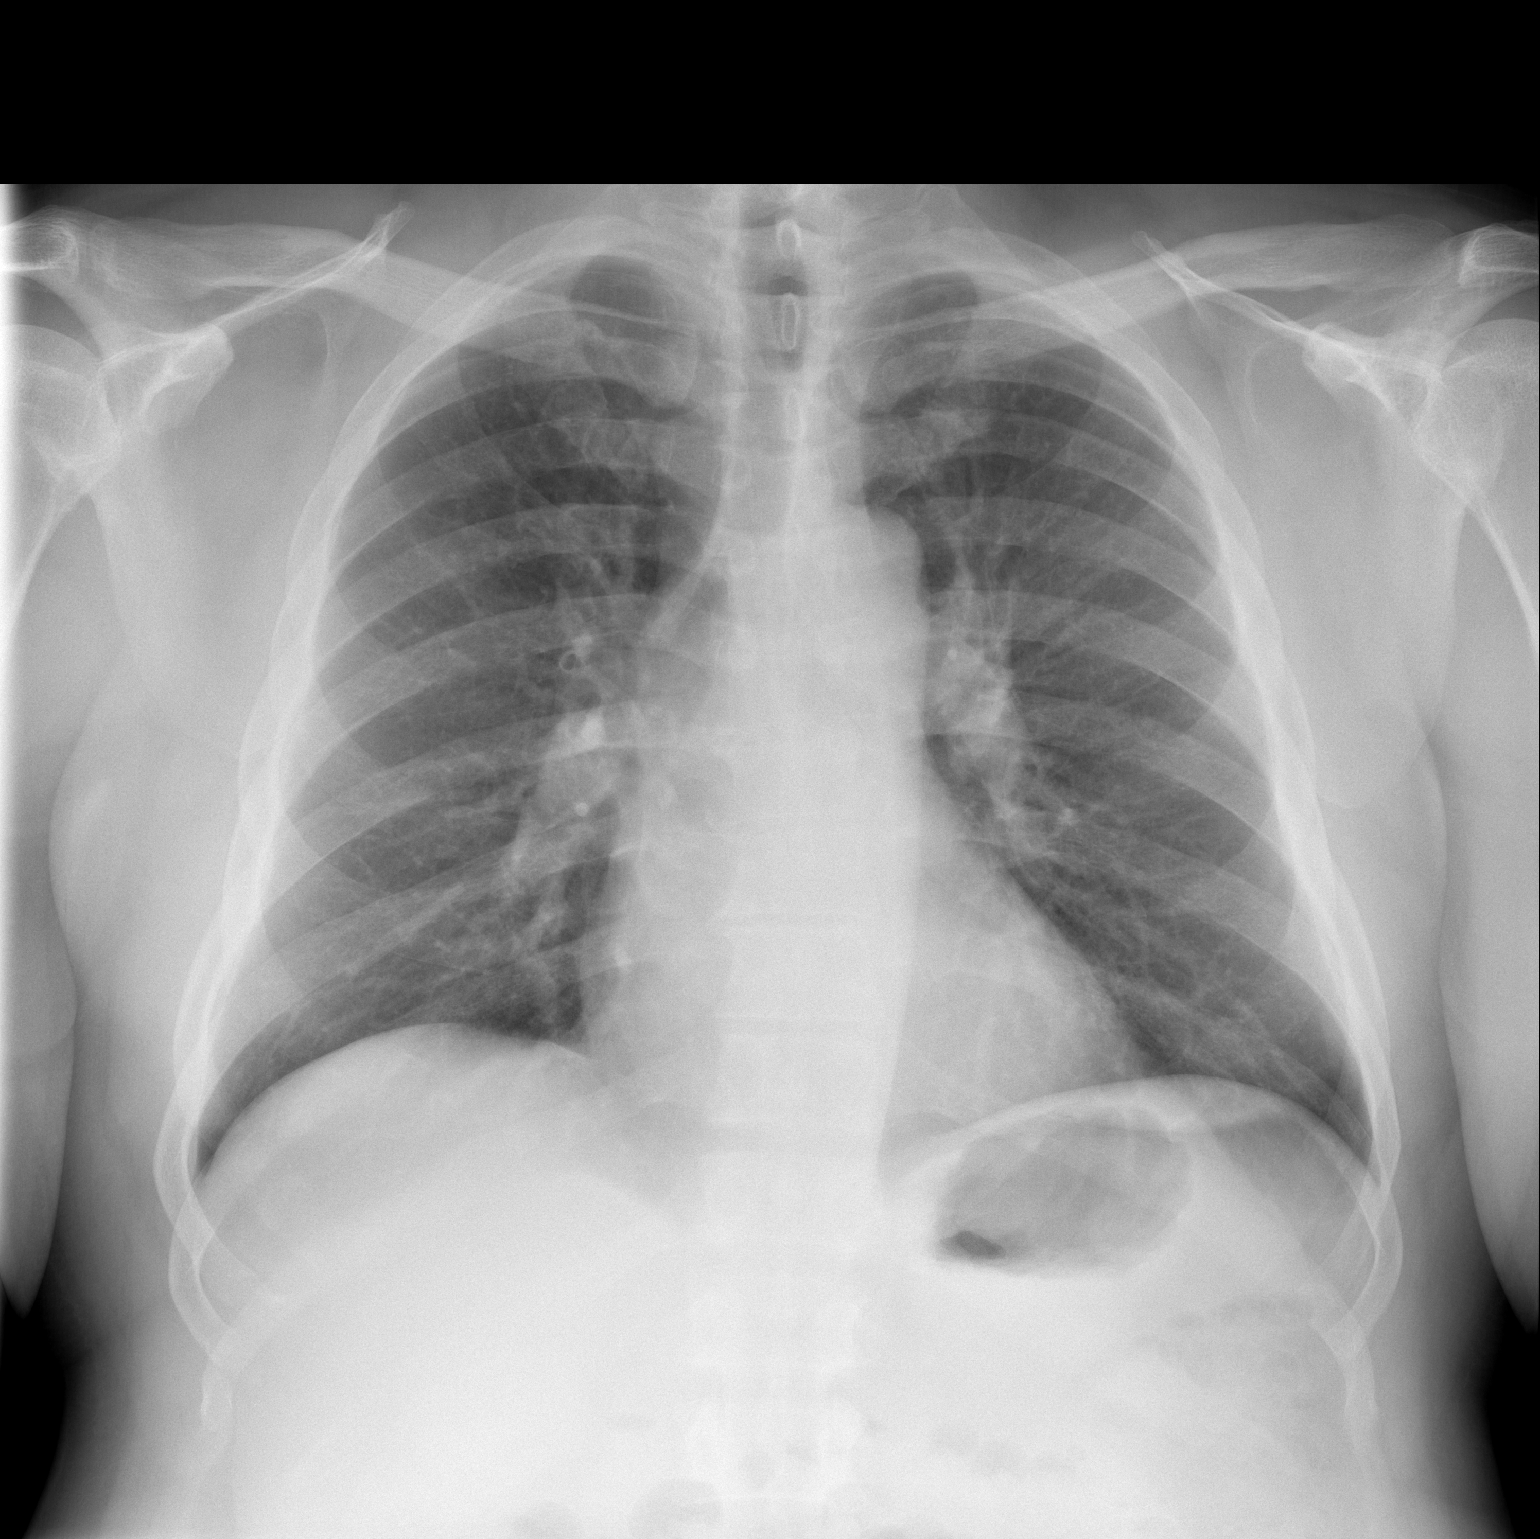

[w chest lat]
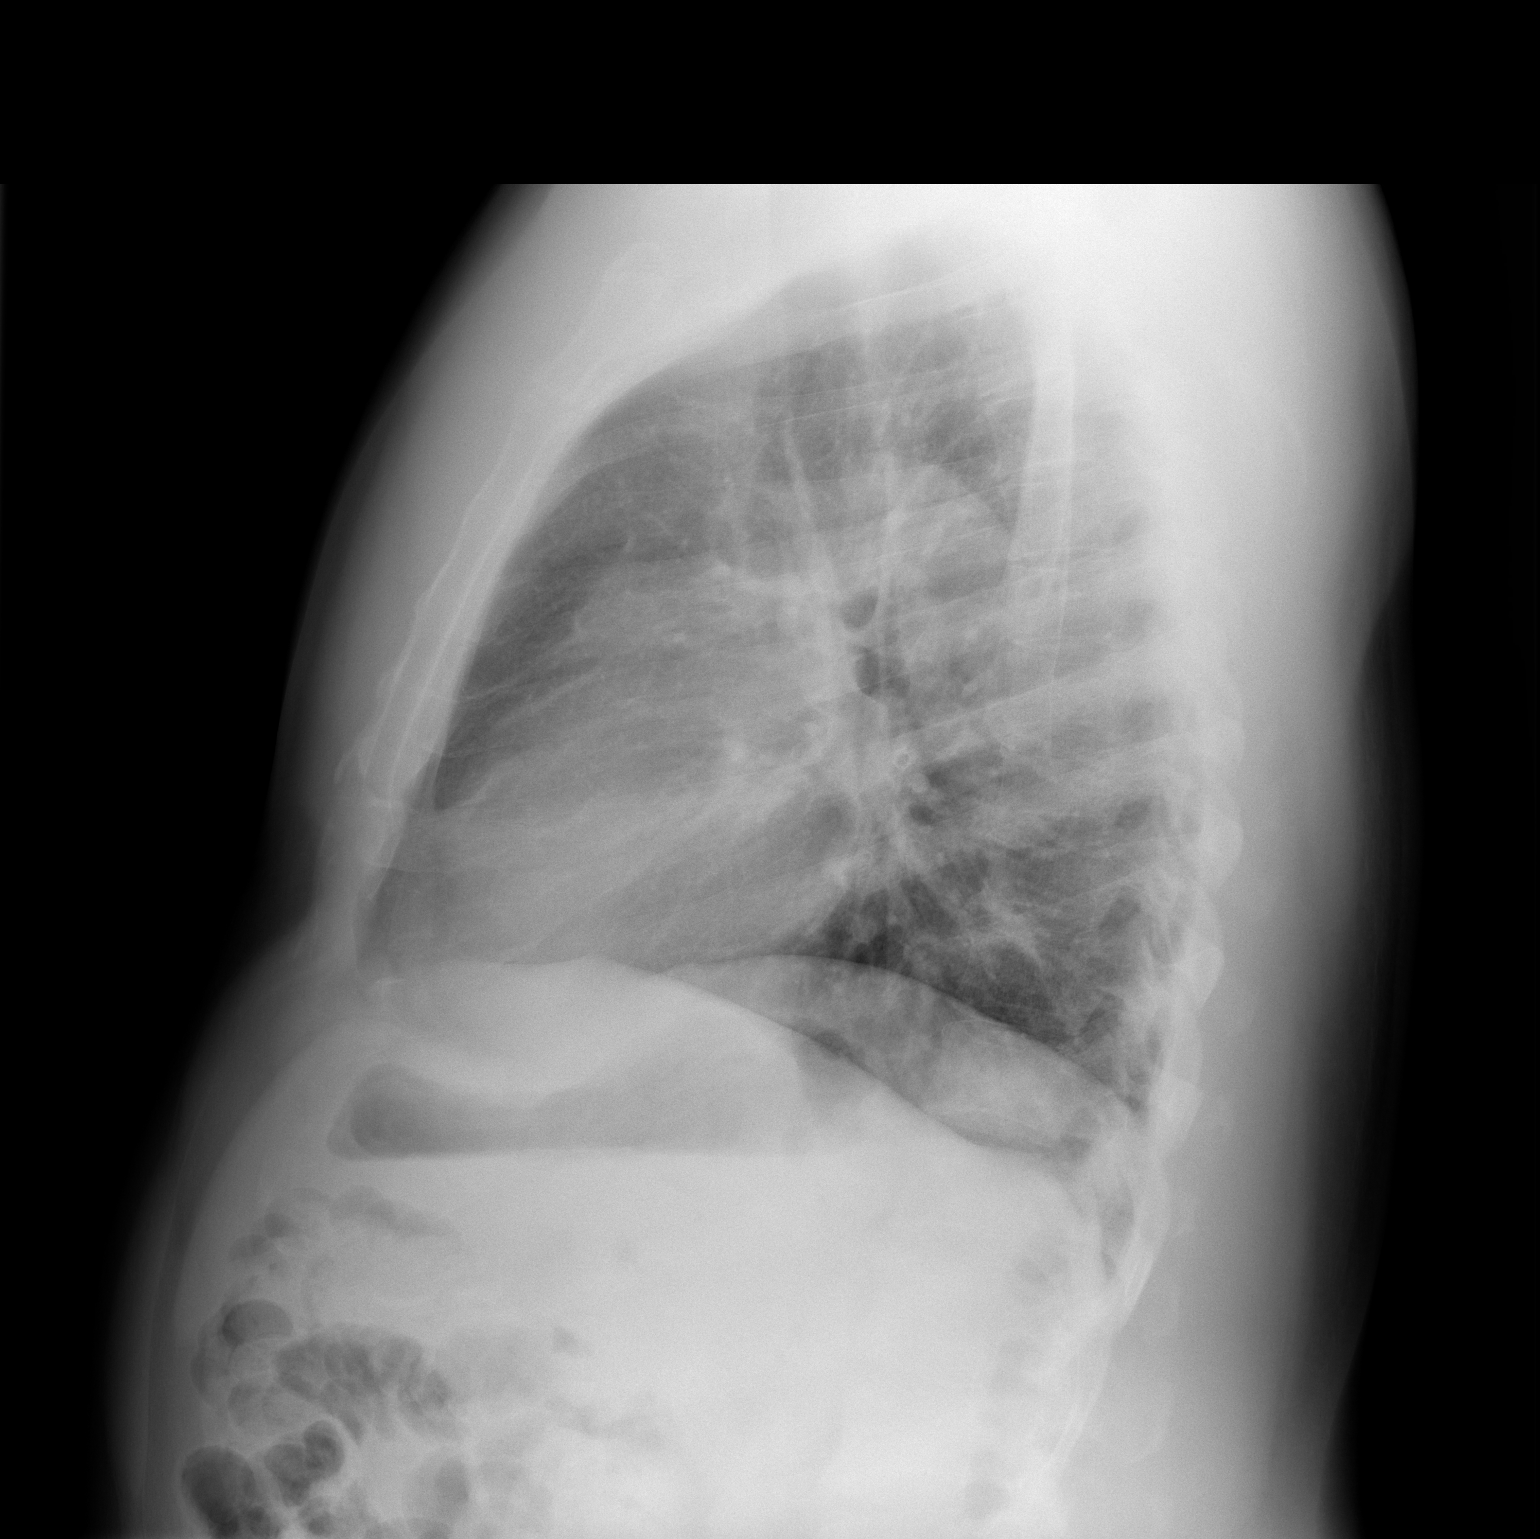

[2 of 2 positions shown; findings below may reference images not displayed]

FINDINGS: The heart size and mediastinal contours are within normal limits.
Both lungs are clear. The visualized skeletal structures are
unremarkable.
IMPRESSION: No active cardiopulmonary disease.

## 2022-10-07 ENCOUNTER — Ambulatory Visit: Payer: Commercial Managed Care - PPO | Admitting: Nurse Practitioner

## 2022-10-07 ENCOUNTER — Encounter: Payer: Commercial Managed Care - PPO | Admitting: Nurse Practitioner

## 2022-10-07 NOTE — Progress Notes (Deleted)
Madelaine Bhat, CMA,acting as a Neurosurgeon for Arnette Felts, FNP.,have documented all relevant documentation on the behalf of Arnette Felts, FNP,as directed by  Arnette Felts, FNP while in the presence of Arnette Felts, FNP.  Subjective:   Patient ID: Glenn Carrillo , male    DOB: [birth year] , 57 y.o.   MRN: [id number]  No chief complaint on file.   HPI  Patient presents today for HM. Patient reports compliance with medications. Patient denies any chest pain, SOB, or headaches. Patient has no other concerns today.     Past Medical History:  Diagnosis Date  . Allergies   . Allergy   . Elevated cholesterol   . Headache   . Hypertension      Family History  Problem Relation Age of Onset  . Colon polyps Mother   . Dementia Mother   . Prostate cancer Father   . Diabetes Paternal Grandfather   . Colon cancer Neg Hx   . Esophageal cancer Neg Hx   . Pancreatic cancer Neg Hx   . Stomach cancer Neg Hx      Current Outpatient Medications:  .  albuterol (VENTOLIN HFA) 108 (90 Base) MCG/ACT inhaler, Inhale 2 puffs into the lungs every 6 (six) hours as needed for wheezing or shortness of breath., Disp: 8 g, Rfl: 2 .  amLODipine (NORVASC) 10 MG tablet, TAKE 1 TABLET BY MOUTH EVERY DAY, Disp: 90 tablet, Rfl: 1 .  amoxicillin-clavulanate (AUGMENTIN) 875-125 MG tablet, Take 1 tablet by mouth 2 (two) times daily., Disp: 20 tablet, Rfl: 0 .  atorvastatin (LIPITOR) 10 MG tablet, Take 1 tablet (10 mg total) by mouth daily., Disp: 90 tablet, Rfl: 1 .  Azelastine-Fluticasone 137-50 MCG/ACT SUSP, Place 2 sprays into the nose 2 (two) times daily., Disp: 23 g, Rfl: 2 .  cetirizine (ZYRTEC) 10 MG tablet, Take 1 tablet (10 mg total) by mouth daily as needed for allergies., Disp: 90 tablet, Rfl: 1 .  meloxicam (MOBIC) 15 MG tablet, TAKE 1 TABLET BY MOUTH DAILY FOR 5 DAYS, THEN TAKE DAILY ONLY AS NEEDED, Disp: 30 tablet, Rfl: 0 .  metFORMIN (GLUCOPHAGE) 500 MG tablet, Take 1 tablet (500 mg total) by mouth daily  with breakfast., Disp: 90 tablet, Rfl: 1 .  mometasone (ELOCON) 0.1 % cream, Apply 1 application topically daily., Disp: 45 g, Rfl: 0 .  sildenafil (VIAGRA) 50 MG tablet, Take 1 tablet (50 mg total) by mouth as needed for erectile dysfunction., Disp: 20 tablet, Rfl: 2   Allergies  Allergen Reactions  . Chlorhexidine Itching  . Prednisone     ACHY JOINTS     Men's preventive visit. Patient Health Questionnaire (PHQ-2) is  Flowsheet Row Office Visit from 04/08/2022 in [facility]  PHQ-2 Total Score 0     . Patient is on a *** diet. Marital status: Married. Relevant history for alcohol use is:  Social History   Substance and Sexual Activity  Alcohol Use Yes   Comment: 8 beers per week  . Relevant history for tobacco use is:  Social History   Tobacco Use  Smoking Status Former  . Current packs/day: 0.00  . Types: Cigarettes  . Quit date: 11/16/1997  . Years since quitting: 24.9  Smokeless Tobacco Never  .   Review of Systems   There were no vitals filed for this visit. There is no height or weight on file to calculate BMI.  Wt Readings from Last 3 Encounters:  04/08/22 235 lb (106.6  kg)  01/26/22 230 lb 3.2 oz (104.4 kg)  12/14/21 225 lb (102.1 kg)    Objective:  Physical Exam      Assessment And Plan:    Encounter for annual health examination  Essential hypertension  Prediabetes  Mixed hyperlipidemia     No follow-ups on file. Patient was given opportunity to ask questions. Patient verbalized understanding of the plan and was able to repeat key elements of the plan. All questions were answered to their satisfaction.   Arnette Felts, FNP  I, Arnette Felts, FNP, have reviewed all documentation for this visit. The documentation on 10/07/22 for the exam, diagnosis, procedures, and orders are all accurate and complete.

## 2022-10-13 ENCOUNTER — Encounter: Payer: Self-pay | Admitting: Nurse Practitioner

## 2022-10-13 ENCOUNTER — Ambulatory Visit (INDEPENDENT_AMBULATORY_CARE_PROVIDER_SITE_OTHER): Payer: Commercial Managed Care - PPO | Admitting: Nurse Practitioner

## 2022-10-13 VITALS — BP 120/80 | HR 62 | Temp 98.4°F | Ht 68.0 in | Wt 226.6 lb

## 2022-10-13 DIAGNOSIS — E782 Mixed hyperlipidemia: Secondary | ICD-10-CM

## 2022-10-13 DIAGNOSIS — I10 Essential (primary) hypertension: Secondary | ICD-10-CM

## 2022-10-13 DIAGNOSIS — R7303 Prediabetes: Secondary | ICD-10-CM | POA: Diagnosis not present

## 2022-10-13 DIAGNOSIS — Z0000 Encounter for general adult medical examination without abnormal findings: Secondary | ICD-10-CM

## 2022-10-13 DIAGNOSIS — Z23 Encounter for immunization: Secondary | ICD-10-CM | POA: Diagnosis not present

## 2022-10-13 DIAGNOSIS — R413 Other amnesia: Secondary | ICD-10-CM | POA: Diagnosis not present

## 2022-10-13 DIAGNOSIS — Z79899 Other long term (current) drug therapy: Secondary | ICD-10-CM

## 2022-10-13 MED ORDER — ATORVASTATIN CALCIUM 10 MG PO TABS: 10.0000 mg | ORAL_TABLET | Freq: Every day | ORAL | 1 refills | Status: DC

## 2022-10-13 MED ORDER — AMLODIPINE BESYLATE 10 MG PO TABS: 10.0000 mg | ORAL_TABLET | Freq: Every day | ORAL | 1 refills | Status: DC

## 2022-10-13 NOTE — Assessment & Plan Note (Signed)
Cholesterol levels are stable, will check lipid panel

## 2022-10-13 NOTE — Assessment & Plan Note (Signed)
HgbA1c is stable, continue current regimen

## 2022-10-13 NOTE — Progress Notes (Unsigned)
Madelaine Bhat, CMA,acting as a Neurosurgeon for Arnette Felts, FNP.,have documented all relevant documentation on the behalf of Arnette Felts, FNP,as directed by  Arnette Felts, FNP while in the presence of Arnette Felts, FNP.  Subjective:   Patient ID: Glenn Carrillo , male    DOB: 08-06-1965 , 57 y.o.   MRN: [id number]  Chief Complaint  Patient presents with   Annual Exam    HPI  Patient presents today for HM. Patient reports compliance with medications. Patient denies any chest pain, SOB, or headaches. Patient reports he had a head injury in 2000 a while back and now the spot that it happened in has pain again, patient reports the pain first started back a few months ago. He had his head injury while at work and can continue to see the Workers comp provider for one more year. He is forgetting a lot of things and has slow response time.   Sees a urologist for his prostate.  Wt Readings from Last 3 Encounters: 10/13/22 : 226 lb 9.6 oz (102.8 kg) 04/08/22 : 235 lb (106.6 kg) 01/26/22 : 230 lb 3.2 oz (104.4 kg)       Past Medical History:  Diagnosis Date   Allergies    Allergy    Elevated cholesterol    Headache    Hypertension      Family History  Problem Relation Age of Onset   Colon polyps Mother    Dementia Mother    Prostate cancer Father    Diabetes Paternal Grandfather    Colon cancer Neg Hx    Esophageal cancer Neg Hx    Pancreatic cancer Neg Hx    Stomach cancer Neg Hx      Current Outpatient Medications:    albuterol (VENTOLIN HFA) 108 (90 Base) MCG/ACT inhaler, Inhale 2 puffs into the lungs every 6 (six) hours as needed for wheezing or shortness of breath., Disp: 8 g, Rfl: 2   cetirizine (ZYRTEC) 10 MG tablet, Take 1 tablet (10 mg total) by mouth daily as needed for allergies., Disp: 90 tablet, Rfl: 1   meloxicam (MOBIC) 15 MG tablet, TAKE 1 TABLET BY MOUTH DAILY FOR 5 DAYS, THEN TAKE DAILY ONLY AS NEEDED, Disp: 30 tablet, Rfl: 0   metFORMIN (GLUCOPHAGE) 500 MG  tablet, Take 1 tablet (500 mg total) by mouth daily with breakfast., Disp: 90 tablet, Rfl: 1   mometasone (ELOCON) 0.1 % cream, Apply 1 application topically daily., Disp: 45 g, Rfl: 0   sildenafil (VIAGRA) 50 MG tablet, Take 1 tablet (50 mg total) by mouth as needed for erectile dysfunction., Disp: 20 tablet, Rfl: 2   amLODipine (NORVASC) 10 MG tablet, Take 1 tablet (10 mg total) by mouth daily., Disp: 90 tablet, Rfl: 1   atorvastatin (LIPITOR) 10 MG tablet, Take 1 tablet (10 mg total) by mouth daily., Disp: 90 tablet, Rfl: 1   Allergies  Allergen Reactions   Chlorhexidine Itching   Prednisone     ACHY JOINTS     Men's preventive visit. Patient Health Questionnaire (PHQ-2) is  Flowsheet Row Office Visit from 10/13/2022 in [facility]  PHQ-2 Total Score 0      Patient is on a regular diet; tries to eat green, fish and chicken and will drink a lot of water; admits to eating an increased amount of sweets. Exercising - 3 days a week at the [facility] for 1-2 hours. With weight lifting and cardio and will walk the park [place] Marital status: Married. Relevant history for alcohol use is:  Social History   Substance and Sexual Activity  Alcohol Use Yes   Comment: 8 beers per week   Relevant history for tobacco use is:  Social History   Tobacco Use  Smoking Status Former   Current packs/day: 0.00   Types: Cigarettes   Quit date: 11/16/1997   Years since quitting: 24.9  Smokeless Tobacco Never  .   Review of Systems  Constitutional: Negative.   HENT: Negative.    Eyes: Negative.   Respiratory: Negative.    Cardiovascular: Negative.   Gastrointestinal: Negative.   Endocrine: Negative.   Genitourinary: Negative.   Musculoskeletal: Negative.   Skin: Negative.   Allergic/Immunologic: Negative.   Neurological:  Positive for headaches.       Feels like his memory is delayed   Hematological: Negative.   Psychiatric/Behavioral: Negative.        Today's Vitals   10/13/22 1008  BP: 120/80  Pulse: 62  Temp: 98.4 F (36.9 C)  TempSrc: Oral  Weight: 226 lb 9.6 oz (102.8 kg)  Height: 5\' 8"  (1.727 m)  PainSc: 0-No pain   Body mass index is 34.45 kg/m.  Wt Readings from Last 3 Encounters:  10/13/22 226 lb 9.6 oz (102.8 kg)  04/08/22 235 lb (106.6 kg)  01/26/22 230 lb 3.2 oz (104.4 kg)    Objective:  Physical Exam Vitals reviewed.  Constitutional:      General: He is not in acute distress.    Appearance: Normal appearance. He is obese.  HENT:     Head: Normocephalic and atraumatic.     Right Ear: Tympanic membrane, ear canal and external ear normal. There is no impacted cerumen.     Left Ear: Tympanic membrane, ear canal and external ear normal. There is no impacted cerumen.  Cardiovascular:     Rate and Rhythm: Normal rate and regular rhythm.     Pulses: Normal pulses.     Heart sounds: Normal heart sounds. No murmur heard. Pulmonary:     Effort: Pulmonary effort is normal. No respiratory distress.     Breath sounds: Normal breath sounds.  Abdominal:     General: Abdomen is flat. Bowel sounds are normal. There is no distension.     Palpations: Abdomen is soft.  Genitourinary:    Comments: Deferred - Followed by urology Musculoskeletal:        General: No swelling. Normal range of motion.     Cervical back: Normal range of motion and neck supple.  Skin:    General: Skin is warm.     Capillary Refill: Capillary refill takes less than 2 seconds.  Neurological:     General: No focal deficit present.     Mental Status: He is alert and oriented to person, place, and time.     Cranial Nerves: No cranial nerve deficit.     Motor: No weakness.  Psychiatric:        Mood and Affect: Mood normal.        Behavior: Behavior normal.        Thought Content: Thought content normal.        Judgment: Judgment normal.        10/13/2022   10:43 AM  6CIT Screen  What Year? 0 points  What month? 0 points  What time? 0  points  Count back from 20 0 points  Months in reverse 0 points  Repeat phrase 2 points  Total Score 2  points       Assessment And Plan:    Encounter for annual health examination Assessment & Plan: Behavior modifications discussed and diet history reviewed.   Pt will continue to exercise regularly and modify diet with low GI, plant based foods and decrease intake of processed foods.  Recommend intake of daily multivitamin, Vitamin D, and calcium.  Recommend mammogram and colonoscopy for preventive screenings, as well as recommend immunizations that include influenza, TDAP, and Shingles (2nd shingrix given today)    Need for shingles vaccine Assessment & Plan: 2nd shingrix administered.   Orders: -     Varicella-zoster vaccine IM  Essential hypertension Assessment & Plan: Blood pressure is well controlled, continue current medications.  EKG done - Sinus Bradycardia WITHIN NORMAL LIMITS HR 58   Orders: -     POCT URINALYSIS DIP (CLINITEK) -     EKG 12-Lead -     CMP14+EGFR -     Microalbumin / creatinine urine ratio -     amLODIPine Besylate; Take 1 tablet (10 mg total) by mouth daily.  Dispense: 90 tablet; Refill: 1  Prediabetes Assessment & Plan: HgbA1c is stable, continue current regimen  Orders: -     Hemoglobin A1c  Mixed hyperlipidemia Assessment & Plan: Cholesterol levels are stable, will check lipid panel  Orders: -     Lipid panel -     Atorvastatin Calcium; Take 1 tablet (10 mg total) by mouth daily.  Dispense: 90 tablet; Refill: 1  Memory changes Assessment & Plan: 6CIT done with score 2, will check metabolic causes.   Orders: -     TSH -     Vitamin B12 -     VITAMIN D 25 Hydroxy (Vit-D Deficiency, Fractures) -     T pallidum Screening Cascade  Other long term (current) drug therapy -     CBC with Differential/Platelet  Other orders -     Urinalysis, Routine w reflex microscopic -     Specimen status report   Return for 1 year physical,  6 month bp check. Patient was given opportunity to ask questions. Patient verbalized understanding of the plan and was able to repeat key elements of the plan. All questions were answered to their satisfaction.   Arnette Felts, FNP  I, Arnette Felts, FNP, have reviewed all documentation for this visit. The documentation on 10/13/22 for the exam, diagnosis, procedures, and orders are all accurate and complete.

## 2022-10-13 NOTE — Assessment & Plan Note (Signed)
6CIT done with score 2, will check metabolic causes.

## 2022-10-13 NOTE — Assessment & Plan Note (Signed)
2nd shingrix administered

## 2022-10-13 NOTE — Assessment & Plan Note (Addendum)
Blood pressure is well controlled, continue current medications.  EKG done - Sinus Bradycardia WITHIN NORMAL LIMITS HR 58

## 2022-10-13 NOTE — Assessment & Plan Note (Signed)

## 2022-10-14 LAB — MICROALBUMIN / CREATININE URINE RATIO
Microalb/Creat Ratio: <2 mg/g creat (ref 0–29)
Microalbumin, Urine: <3 ug/mL

## 2022-10-24 MED ORDER — VITAMIN D (ERGOCALCIFEROL) 1.25 MG (50000 UNIT) PO CAPS: 50000.0000 [IU] | ORAL_CAPSULE | ORAL | 1 refills | Status: AC

## 2023-02-22 ENCOUNTER — Encounter: Payer: Self-pay | Admitting: Nurse Practitioner

## 2023-02-22 ENCOUNTER — Ambulatory Visit: Payer: Commercial Managed Care - PPO | Admitting: Nurse Practitioner

## 2023-02-22 VITALS — BP 130/86 | HR 77 | Temp 97.7°F | Ht 68.0 in | Wt 238.0 lb

## 2023-02-22 DIAGNOSIS — I10 Essential (primary) hypertension: Secondary | ICD-10-CM

## 2023-02-22 DIAGNOSIS — Z79899 Other long term (current) drug therapy: Secondary | ICD-10-CM

## 2023-02-22 DIAGNOSIS — Z2821 Immunization not carried out because of patient refusal: Secondary | ICD-10-CM | POA: Insufficient documentation

## 2023-02-22 DIAGNOSIS — R051 Acute cough: Secondary | ICD-10-CM

## 2023-02-22 DIAGNOSIS — J3489 Other specified disorders of nose and nasal sinuses: Secondary | ICD-10-CM

## 2023-02-22 DIAGNOSIS — E782 Mixed hyperlipidemia: Secondary | ICD-10-CM | POA: Diagnosis not present

## 2023-02-22 DIAGNOSIS — R7303 Prediabetes: Secondary | ICD-10-CM

## 2023-02-22 MED ORDER — ALBUTEROL SULFATE HFA 108 (90 BASE) MCG/ACT IN AERS: 2.0000 | INHALATION_SPRAY | Freq: Four times a day (QID) | RESPIRATORY_TRACT | 2 refills | Status: AC | PRN

## 2023-02-22 MED ORDER — AMOXICILLIN-POT CLAVULANATE 875-125 MG PO TABS: 1.0000 | ORAL_TABLET | Freq: Two times a day (BID) | ORAL | 0 refills | Status: DC

## 2023-02-22 MED ORDER — AMLODIPINE BESYLATE 10 MG PO TABS: 10.0000 mg | ORAL_TABLET | Freq: Every day | ORAL | 1 refills | Status: DC

## 2023-02-22 NOTE — Assessment & Plan Note (Signed)
Blood pressure is well controlled, continue current medications.

## 2023-02-22 NOTE — Progress Notes (Signed)
I,Jameka J Llittleton, CMA,acting as a Neurosurgeon for [employer], FNP.,have documented all relevant documentation on the behalf of Arnette Felts, FNP,as directed by  Arnette Felts, FNP while in the presence of Arnette Felts, FNP.  Subjective:  Patient ID: Glenn Carrillo , male    DOB: 08/09/1965 , 57 y.o.   MRN: [id number]  Chief Complaint  Patient presents with   URI    HPI  Patient presents today for cold symptoms. He stated his symptoms started on Friday. Patient reports he has had a cough sinus pain and pressure, nasal congestion and headaches.  He does take a [employer] brand nasal spray and allergy tablets. He did take a decongestant over the weekend. He has taken his BP medications today. He has been taking diclofenac 2 times a day for his right knee pain.   URI  This is a new problem. The current episode started in the past 7 days. There has been no fever. Associated symptoms include congestion, coughing (intermittent and sometimes has secretions), headaches (head pressure), rhinorrhea and sinus pain. Pertinent negatives include no chest pain, sore throat or vomiting. He has tried antihistamine and decongestant for the symptoms. The treatment provided mild relief.     Past Medical History:  Diagnosis Date   Allergies    Allergy    Elevated cholesterol    Headache    Hypertension      Family History  Problem Relation Age of Onset   Colon polyps Mother    Dementia Mother    Prostate cancer Father    Diabetes Paternal Grandfather    Colon cancer Neg Hx    Esophageal cancer Neg Hx    Pancreatic cancer Neg Hx    Stomach cancer Neg Hx      Current Outpatient Medications:    amoxicillin-clavulanate (AUGMENTIN) 875-125 MG tablet, Take 1 tablet by mouth 2 (two) times daily., Disp: 14 tablet, Rfl: 0   atorvastatin (LIPITOR) 10 MG tablet, Take 1 tablet (10 mg total) by mouth daily., Disp: 90 tablet, Rfl: 1   cetirizine (ZYRTEC) 10 MG tablet, Take 1 tablet (10 mg total) by mouth daily as  needed for allergies., Disp: 90 tablet, Rfl: 1   meloxicam (MOBIC) 15 MG tablet, TAKE 1 TABLET BY MOUTH DAILY FOR 5 DAYS, THEN TAKE DAILY ONLY AS NEEDED, Disp: 30 tablet, Rfl: 0   metFORMIN (GLUCOPHAGE) 500 MG tablet, Take 1 tablet (500 mg total) by mouth daily with breakfast., Disp: 90 tablet, Rfl: 1   mometasone (ELOCON) 0.1 % cream, Apply 1 application topically daily., Disp: 45 g, Rfl: 0   sildenafil (VIAGRA) 50 MG tablet, Take 1 tablet (50 mg total) by mouth as needed for erectile dysfunction., Disp: 20 tablet, Rfl: 2   Vitamin D, Ergocalciferol, (DRISDOL) 1.25 MG (50000 UNIT) CAPS capsule, Take 1 capsule (50,000 Units total) by mouth every 7 (seven) days., Disp: 12 capsule, Rfl: 1   albuterol (VENTOLIN HFA) 108 (90 Base) MCG/ACT inhaler, Inhale 2 puffs into the lungs every 6 (six) hours as needed for wheezing or shortness of breath., Disp: 8 g, Rfl: 2   amLODipine (NORVASC) 10 MG tablet, Take 1 tablet (10 mg total) by mouth daily., Disp: 90 tablet, Rfl: 1   Allergies  Allergen Reactions   Chlorhexidine Itching   Prednisone     ACHY JOINTS     Review of Systems  HENT:  Positive for congestion, rhinorrhea and sinus pain. Negative for sore throat.   Respiratory:  Positive for cough (intermittent and sometimes  has secretions) and shortness of breath (not much per patient - reports occurs in mornings or late in the evening.).   Cardiovascular: Negative.  Negative for chest pain.  Gastrointestinal:  Negative for vomiting.  Neurological:  Positive for headaches (head pressure).  Psychiatric/Behavioral: Negative.       Today's Vitals   02/22/23 1114 02/22/23 1613  BP: (!) 130/90 130/86  Pulse: 77   Temp: 97.7 F (36.5 C)   Weight: 238 lb (108 kg)   Height: 5\' 8"  (1.727 m)   PainSc: 6    PainLoc: Face    Body mass index is 36.19 kg/m.  Wt Readings from Last 3 Encounters:  02/22/23 238 lb (108 kg)  10/13/22 226 lb 9.6 oz (102.8 kg)  04/08/22 235 lb (106.6 kg)     Objective:   Physical Exam Vitals reviewed.  Constitutional:      General: He is not in acute distress.    Appearance: Normal appearance. He is obese.  HENT:     Head: Normocephalic.     Right Ear: Tympanic membrane, ear canal and external ear normal. There is no impacted cerumen.     Left Ear: Tympanic membrane, ear canal and external ear normal. There is no impacted cerumen.     Nose: Congestion present.     Right Turbinates: Swollen.     Left Turbinates: Swollen.     Mouth/Throat:     Mouth: Mucous membranes are moist.     Comments: Deferred - masked Cardiovascular:     Rate and Rhythm: Normal rate and regular rhythm.     Pulses: Normal pulses.     Heart sounds: Normal heart sounds. No murmur heard. Pulmonary:     Effort: Pulmonary effort is normal. No respiratory distress.     Breath sounds: Normal breath sounds. No wheezing.  Musculoskeletal:     Cervical back: Normal range of motion and neck supple.  Skin:    General: Skin is warm and dry.     Capillary Refill: Capillary refill takes less than 2 seconds.  Neurological:     General: No focal deficit present.     Mental Status: He is alert and oriented to person, place, and time.     Cranial Nerves: No cranial nerve deficit.     Motor: No weakness.  Psychiatric:        Mood and Affect: Mood normal.        Behavior: Behavior normal.        Thought Content: Thought content normal.        Judgment: Judgment normal.         Assessment And Plan:  Sinus pain Assessment & Plan: Tenderness to frontal sinuses. Due to previous history of sinus infections will treat with antibiotic.   Orders: -     Amoxicillin-Pot Clavulanate; Take 1 tablet by mouth 2 (two) times daily.  Dispense: 14 tablet; Refill: 0  Acute cough  Influenza vaccination declined Assessment & Plan: Currently has a possible sinus infection   COVID-19 vaccination declined Assessment & Plan: Currently has a possible sinus infection   Essential  hypertension Assessment & Plan: Blood pressure is well controlled, continue current medications.    Orders: -     amLODIPine Besylate; Take 1 tablet (10 mg total) by mouth daily.  Dispense: 90 tablet; Refill: 1 -     CMP14+EGFR  Mixed hyperlipidemia Assessment & Plan: Cholesterol levels are stable, will check lipid panel  Orders: -     Hemoglobin A1c  Prediabetes Assessment & Plan: HgbA1c is stable, continue focusing on healthy diet and regular exercise.   Orders: -     CMP14+EGFR  Other long term (current) drug therapy -     CBC  Other orders -     Albuterol Sulfate HFA; Inhale 2 puffs into the lungs every 6 (six) hours as needed for wheezing or shortness of breath.  Dispense: 8 g; Refill: 2   Return if symptoms worsen or fail to improve, for Change appt in January to March.  Patient was given opportunity to ask questions. Patient verbalized understanding of the plan and was able to repeat key elements of the plan. All questions were answered to their satisfaction.    Jeanell Sparrow, FNP, have reviewed all documentation for this visit. The documentation on 02/22/23 for the exam, diagnosis, procedures, and orders are all accurate and complete.   IF YOU HAVE BEEN REFERRED TO A SPECIALIST, IT MAY TAKE 1-2 WEEKS TO SCHEDULE/PROCESS THE REFERRAL. IF YOU HAVE NOT HEARD FROM US/SPECIALIST IN TWO WEEKS, PLEASE GIVE [place] A CALL AT [phone].

## 2023-02-23 LAB — CMP14+EGFR
ALT: 26 [IU]/L (ref 0–44)
AST: 19 [IU]/L (ref 0–40)
Albumin: 4.3 g/dL (ref 3.8–4.9)
Alkaline Phosphatase: 58 [IU]/L (ref 44–121)
BUN/Creatinine Ratio: 11 (ref 9–20)
BUN: 13 mg/dL (ref 6–24)
Bilirubin Total: 0.4 mg/dL (ref 0.0–1.2)
CO2: 24 mmol/L (ref 20–29)
Calcium: 9.5 mg/dL (ref 8.7–10.2)
Chloride: 104 mmol/L (ref 96–106)
Creatinine, Ser: 1.19 mg/dL (ref 0.76–1.27)
Globulin, Total: 2.9 g/dL (ref 1.5–4.5)
Glucose: 99 mg/dL (ref 70–99)
Potassium: 4.9 mmol/L (ref 3.5–5.2)
Sodium: 141 mmol/L (ref 134–144)
Total Protein: 7.2 g/dL (ref 6.0–8.5)
eGFR: 70 mL/min/{1.73_m2} (ref >59)

## 2023-02-23 LAB — CBC
Hematocrit: 43 % (ref 37.5–51.0)
Hemoglobin: 14.3 g/dL (ref 13.0–17.7)
MCH: 29.6 pg (ref 26.6–33.0)
MCHC: 33.3 g/dL (ref 31.5–35.7)
MCV: 89 fL (ref 79–97)
Platelets: 222 10*3/uL (ref 150–450)
RBC: 4.83 x10E6/uL (ref 4.14–5.80)
RDW: 13.3 % (ref 11.6–15.4)
WBC: 6.2 10*3/uL (ref 3.4–10.8)

## 2023-02-23 LAB — HEMOGLOBIN A1C
Est. average glucose Bld gHb Est-mCnc: 140 mg/dL
Hgb A1c MFr Bld: 6.5 % — ABNORMAL HIGH (ref 4.8–5.6)

## 2023-03-01 NOTE — Assessment & Plan Note (Signed)
HgbA1c is stable, continue focusing on healthy diet and regular exercise

## 2023-03-01 NOTE — Assessment & Plan Note (Signed)
Currently has a possible sinus infection

## 2023-03-01 NOTE — Assessment & Plan Note (Signed)
Cholesterol levels are stable, will check lipid panel

## 2023-03-01 NOTE — Assessment & Plan Note (Signed)
Tenderness to frontal sinuses. Due to previous history of sinus infections will treat with antibiotic.

## 2023-03-21 ENCOUNTER — Telehealth: Payer: Self-pay | Admitting: Psychiatry

## 2023-03-21 NOTE — Telephone Encounter (Signed)
Pt previously seen Dr. Delena Bali and was told if symptoms do not seem to be getting better to call. Pt states he iis still  having pain in his head and memory problems which he is concerned about. Requesting call back

## 2023-03-21 NOTE — Telephone Encounter (Signed)
I called pt.  He stated he has not had any change from last visit.  He stated that over time he would get better but this has  not happened.  Last seen 04-29-2021.  I told him that could be 2 or more months to get appt with another provider.  He has not had any acute symptoms.  Just needs an appt with new provider.

## 2023-04-18 ENCOUNTER — Ambulatory Visit: Payer: Commercial Managed Care - PPO | Admitting: Nurse Practitioner

## 2023-05-09 ENCOUNTER — Other Ambulatory Visit: Payer: Self-pay | Admitting: Nurse Practitioner

## 2023-05-09 DIAGNOSIS — N528 Other male erectile dysfunction: Secondary | ICD-10-CM

## 2023-06-07 ENCOUNTER — Ambulatory Visit: Payer: Commercial Managed Care - PPO | Admitting: Diagnostic Neuroimaging

## 2023-06-07 ENCOUNTER — Encounter: Payer: Self-pay | Admitting: Diagnostic Neuroimaging

## 2023-06-07 VITALS — BP 134/70 | HR 74 | Ht 69.0 in | Wt 241.6 lb

## 2023-06-07 DIAGNOSIS — R519 Headache, unspecified: Secondary | ICD-10-CM

## 2023-06-07 DIAGNOSIS — G4710 Hypersomnia, unspecified: Secondary | ICD-10-CM | POA: Diagnosis not present
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
## 2023-06-07 NOTE — Patient Instructions (Signed)
 POST CONCUSSION SYNDROME (headaches, memory loss) - check MRI brain, sleep study - use ibuprofen, tylenol as needed for breakthrough headaches - try to stay active physically and get some exercise (at least 15-30 minutes per day) - eat a nutritious diet with lean protein, plants / vegetables, whole grains; avoid ultra-processed foods - increase social activities, brain stimulation, games, puzzles, hobbies, crafts, arts, music; try new activities; keep it fun! - aim for at least 7-8 hours sleep per night (or more) - avoid smoking and alcohol

## 2023-06-14 ENCOUNTER — Telehealth: Payer: Self-pay | Admitting: Diagnostic Neuroimaging

## 2023-06-14 NOTE — Telephone Encounter (Signed)
 [facility] NPR Decision ID: [id number] sent to GI [phone]

## 2023-06-16 ENCOUNTER — Ambulatory Visit: Payer: Commercial Managed Care - PPO | Admitting: Nurse Practitioner

## 2023-06-21 NOTE — Progress Notes (Signed)
 [place], CMA,acting as a Neurosurgeon for Glenn Epley, FNP.,have documented all relevant documentation on the behalf of Glenn Epley, FNP,as directed by  Glenn Epley, FNP while in the presence of Glenn Epley, FNP.  Subjective:  Patient ID: Glenn Carrillo , male    DOB: Jan 17, 1966 , 58 y.o.   MRN: [id number]  Chief Complaint  Patient presents with   Hypertension    HPI  Patient presents today for right knee pain. He has a limp when he walks he injured it a few months ago.  He has pain in his right knee that travels to the back of his leg. He also reports it hurts more on the side of the knee. He reports he rolled out of the bed and landed on the floor bumping his knee.Patient reports compliance with medication. Patient denies any chest pain, SOB, or headaches.    He stopped taking metformin about one week after he started the medications. He is willing to take a weekly injection for diabetes if necessary.     Past Medical History:  Diagnosis Date   Allergies    Allergy    Elevated cholesterol    Headache    Hypertension      Family History  Problem Relation Age of Onset   Colon polyps Mother    Dementia Mother    Prostate cancer Father    Diabetes Paternal Grandfather    Colon cancer Neg Hx    Esophageal cancer Neg Hx    Pancreatic cancer Neg Hx    Stomach cancer Neg Hx      Current Outpatient Medications:    albuterol (VENTOLIN HFA) 108 (90 Base) MCG/ACT inhaler, Inhale 2 puffs into the lungs every 6 (six) hours as needed for wheezing or shortness of breath., Disp: 8 g, Rfl: 2   amLODipine (NORVASC) 10 MG tablet, Take 1 tablet (10 mg total) by mouth daily., Disp: 90 tablet, Rfl: 1   atorvastatin (LIPITOR) 10 MG tablet, Take 1 tablet (10 mg total) by mouth daily., Disp: 90 tablet, Rfl: 1   sildenafil (VIAGRA) 50 MG tablet, TAKE 1 TABLET BY MOUTH AS NEEDED ERECTILE DYSFUNCTION, Disp: 20 tablet, Rfl: 0   amoxicillin-clavulanate (AUGMENTIN) 875-125 MG tablet, Take 1 tablet  by mouth 2 (two) times daily. (Patient not taking: Reported on 06/22/2023), Disp: 14 tablet, Rfl: 0   cetirizine (ZYRTEC) 10 MG tablet, Take 1 tablet (10 mg total) by mouth daily as needed for allergies. (Patient not taking: Reported on 06/22/2023), Disp: 90 tablet, Rfl: 1   meloxicam (MOBIC) 15 MG tablet, TAKE 1 TABLET BY MOUTH DAILY FOR 5 DAYS, THEN TAKE DAILY ONLY AS NEEDED (Patient not taking: Reported on 06/22/2023), Disp: 30 tablet, Rfl: 0   Vitamin D, Ergocalciferol, (DRISDOL) 1.25 MG (50000 UNIT) CAPS capsule, Take 1 capsule (50,000 Units total) by mouth every 7 (seven) days. (Patient not taking: Reported on 06/22/2023), Disp: 12 capsule, Rfl: 1   Allergies  Allergen Reactions   Chlorhexidine Itching   Prednisone     ACHY JOINTS     Review of Systems  Constitutional: Negative.   Respiratory: Negative.    Cardiovascular: Negative.   Musculoskeletal: Negative.   Neurological: Negative.   Psychiatric/Behavioral: Negative.       Today's Vitals   06/22/23 1538 06/22/23 1635  BP: 130/82 132/80  Pulse: 74   Temp: 97.9 F (36.6 C)   TempSrc: Oral   Weight: 243 lb 6.4 oz (110.4 kg)   Height: 5\' 9"  (1.753 m)  Body mass index is 35.94 kg/m.  Wt Readings from Last 3 Encounters:  06/22/23 243 lb 6.4 oz (110.4 kg)  06/07/23 241 lb 9.6 oz (109.6 kg)  02/22/23 238 lb (108 kg)     Objective:  Physical Exam Vitals reviewed.  Constitutional:      General: He is not in acute distress.    Appearance: Normal appearance. He is obese.  HENT:     Head: Normocephalic.     Mouth/Throat:     Comments: Deferred - masked Cardiovascular:     Rate and Rhythm: Normal rate and regular rhythm.     Pulses: Normal pulses.     Heart sounds: Normal heart sounds. No murmur heard. Pulmonary:     Effort: Pulmonary effort is normal. No respiratory distress.     Breath sounds: Normal breath sounds. No wheezing.  Musculoskeletal:     Cervical back: Normal range of motion and neck supple.  Skin:     General: Skin is warm and dry.     Capillary Refill: Capillary refill takes less than 2 seconds.  Neurological:     General: No focal deficit present.     Mental Status: He is alert and oriented to person, place, and time.     Cranial Nerves: No cranial nerve deficit.     Motor: No weakness.  Psychiatric:        Mood and Affect: Mood normal.        Behavior: Behavior normal.        Thought Content: Thought content normal.        Judgment: Judgment normal.         Assessment And Plan:  Acute pain of right knee Assessment & Plan: Tenderness on palpation to lateral aspect of knee after suffering a fall  Orders: -     Ambulatory referral to Orthopedic Surgery  Essential hypertension Assessment & Plan: Blood pressure is well controlled, continue current medications.    Orders: -     BMP8+eGFR  Mixed hyperlipidemia Assessment & Plan: Cholesterol levels are stable, will check lipid panel  Orders: -     Lipid panel  Class 2 obesity due to excess calories without serious comorbidity with body mass index (BMI) of 35.0 to 35.9 in adult Assessment & Plan: He is encouraged to strive for BMI less than 30 to decrease cardiac risk. Advised to aim for at least 150 minutes of exercise per week as tolerated    COVID-19 vaccination declined Assessment & Plan: Declines covid 19 vaccine. Discussed risk of covid 50 and if he changes her mind about the vaccine to call the office. Education has been provided regarding the importance of this vaccine but patient still declined. Advised may receive this vaccine at local pharmacy or Health Dept.or vaccine clinic. Aware to provide a copy of the vaccination record if obtained from local pharmacy or Health Dept.  Encouraged to take multivitamin, vitamin d, vitamin c and zinc to increase immune system. Aware can call office if would like to have vaccine here at office. Verbalized acceptance and understanding.      Return for keep same next.   Patient was given opportunity to ask questions. Patient verbalized understanding of the plan and was able to repeat key elements of the plan. All questions were answered to their satisfaction.    [name], FNP, have reviewed all documentation for this visit. The documentation on 06/22/23 for the exam, diagnosis, procedures, and orders are all accurate and complete.  IF YOU HAVE BEEN REFERRED TO A SPECIALIST, IT MAY TAKE 1-2 WEEKS TO SCHEDULE/PROCESS THE REFERRAL. IF YOU HAVE NOT HEARD FROM US /SPECIALIST IN TWO WEEKS, PLEASE GIVE US  A CALL AT [phone].

## 2023-06-22 ENCOUNTER — Encounter: Payer: Self-pay | Admitting: Nurse Practitioner

## 2023-06-22 ENCOUNTER — Ambulatory Visit: Admitting: Nurse Practitioner

## 2023-06-22 VITALS — BP 132/80 | HR 74 | Temp 97.9°F | Ht 69.0 in | Wt 243.4 lb

## 2023-06-22 DIAGNOSIS — E66812 Obesity, class 2: Secondary | ICD-10-CM | POA: Diagnosis not present

## 2023-06-22 DIAGNOSIS — Z6835 Body mass index (BMI) 35.0-35.9, adult: Secondary | ICD-10-CM

## 2023-06-22 DIAGNOSIS — M25561 Pain in right knee: Secondary | ICD-10-CM | POA: Diagnosis not present

## 2023-06-22 DIAGNOSIS — I10 Essential (primary) hypertension: Secondary | ICD-10-CM | POA: Diagnosis not present

## 2023-06-22 DIAGNOSIS — E782 Mixed hyperlipidemia: Secondary | ICD-10-CM

## 2023-06-22 DIAGNOSIS — Z2821 Immunization not carried out because of patient refusal: Secondary | ICD-10-CM

## 2023-06-22 DIAGNOSIS — E6609 Other obesity due to excess calories: Secondary | ICD-10-CM

## 2023-06-23 ENCOUNTER — Encounter: Payer: Self-pay | Admitting: Nurse Practitioner

## 2023-06-23 LAB — BMP8+EGFR
BUN/Creatinine Ratio: 12 (ref 9–20)
BUN: 14 mg/dL (ref 6–24)
CO2: 22 mmol/L (ref 20–29)
Calcium: 9.4 mg/dL (ref 8.7–10.2)
Chloride: 104 mmol/L (ref 96–106)
Creatinine, Ser: 1.15 mg/dL (ref 0.76–1.27)
Glucose: 96 mg/dL (ref 70–99)
Potassium: 4.2 mmol/L (ref 3.5–5.2)
Sodium: 143 mmol/L (ref 134–144)
eGFR: 73 mL/min/{1.73_m2} (ref >59)

## 2023-06-23 LAB — LIPID PANEL
Chol/HDL Ratio: 4.2 ratio (ref 0.0–5.0)
Cholesterol, Total: 190 mg/dL (ref 100–199)
HDL: 45 mg/dL (ref >39)
LDL Chol Calc (NIH): 116 mg/dL — ABNORMAL HIGH (ref 0–99)
Triglycerides: 167 mg/dL — ABNORMAL HIGH (ref 0–149)
VLDL Cholesterol Cal: 29 mg/dL (ref 5–40)

## 2023-07-04 DIAGNOSIS — M25561 Pain in right knee: Secondary | ICD-10-CM | POA: Insufficient documentation

## 2023-07-04 DIAGNOSIS — E6609 Other obesity due to excess calories: Secondary | ICD-10-CM | POA: Insufficient documentation

## 2023-07-04 NOTE — Assessment & Plan Note (Signed)
Cholesterol levels are stable, will check lipid panel

## 2023-07-04 NOTE — Assessment & Plan Note (Signed)
 Blood pressure is well controlled, continue current medications.

## 2023-07-04 NOTE — Assessment & Plan Note (Addendum)
 Tenderness on palpation to lateral aspect of knee after suffering a fall

## 2023-07-04 NOTE — Assessment & Plan Note (Signed)

## 2023-07-04 NOTE — Assessment & Plan Note (Addendum)
 He is encouraged to strive for BMI less than 30 to decrease cardiac risk. Advised to aim for at least 150 minutes of exercise per week as tolerated

## 2023-07-06 ENCOUNTER — Other Ambulatory Visit: Payer: Self-pay | Admitting: Nurse Practitioner

## 2023-07-06 DIAGNOSIS — E782 Mixed hyperlipidemia: Secondary | ICD-10-CM

## 2023-07-17 ENCOUNTER — Ambulatory Visit
Admission: RE | Admit: 2023-07-17 | Discharge: 2023-07-17 | Disposition: A | Discharge status: Home / Self Care | Source: Ambulatory Visit | Attending: Diagnostic Neuroimaging | Admitting: Diagnostic Neuroimaging

## 2023-07-17 DIAGNOSIS — R519 Headache, unspecified: Secondary | ICD-10-CM | POA: Diagnosis not present

## 2023-07-17 DIAGNOSIS — G4710 Hypersomnia, unspecified: Secondary | ICD-10-CM

## 2023-07-17 MED ORDER — GADOPICLENOL 0.5 MMOL/ML IV SOLN
10.0000 mL | Freq: Once | INTRAVENOUS | Status: AC | PRN
  Administered 2023-07-17: 10 mL via INTRAVENOUS

## 2023-07-18 ENCOUNTER — Encounter: Payer: Self-pay | Admitting: Diagnostic Neuroimaging

## 2023-07-19 ENCOUNTER — Other Ambulatory Visit: Payer: Self-pay | Admitting: Nurse Practitioner

## 2023-07-19 DIAGNOSIS — I10 Essential (primary) hypertension: Secondary | ICD-10-CM

## 2023-09-12 ENCOUNTER — Ambulatory Visit (HOSPITAL_COMMUNITY)
Admission: EM | Admit: 2023-09-12 | Discharge: 2023-09-12 | Disposition: A | Discharge status: Home / Self Care | Attending: Internal Medicine | Admitting: Internal Medicine

## 2023-09-12 DIAGNOSIS — J0100 Acute maxillary sinusitis, unspecified: Secondary | ICD-10-CM

## 2023-09-12 MED ORDER — FLUTICASONE PROPIONATE 50 MCG/ACT NA SUSP
2.0000 | Freq: Every day | NASAL | 2 refills | Status: DC
Start: 2023-09-12 — End: 2024-01-02

## 2023-09-12 MED ORDER — PROMETHAZINE-DM 6.25-15 MG/5ML PO SYRP
5.0000 mL | ORAL_SOLUTION | Freq: Three times a day (TID) | ORAL | 0 refills | Status: AC | PRN
Start: 2023-09-12 — End: ?

## 2023-09-12 MED ORDER — AMOXICILLIN-POT CLAVULANATE 875-125 MG PO TABS: 1.0000 | ORAL_TABLET | Freq: Two times a day (BID) | ORAL | 0 refills | Status: AC

## 2023-09-12 NOTE — Discharge Instructions (Addendum)
 Symptoms and physical exam findings are most consistent with a maxillary sinus infection. We will treat with the following:  Augmentin  875 mg twice daily for 10 days.  This is an antibiotic.  Take this with food.  Flonase  2 sprays each nostril once daily for 10 days for nasal congestion.  May use as needed after this. Promethazine DM 5 mL every 8 hours as needed for cough.  Use caution as this medication can cause drowsiness.  Continue to use tylenol  for pain/fevers Rest and stay hydrated.   Return to urgent care or PCP if symptoms worsen or fail to resolve.
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
## 2023-09-12 NOTE — ED Triage Notes (Signed)
 Patient presents to the office for cough, sinus pressure and headache x 2 days. Patient states he had chills last night.

## 2023-10-18 ENCOUNTER — Encounter: Payer: Self-pay | Admitting: Nurse Practitioner

## 2023-10-18 ENCOUNTER — Ambulatory Visit (INDEPENDENT_AMBULATORY_CARE_PROVIDER_SITE_OTHER): Payer: Commercial Managed Care - PPO | Admitting: Nurse Practitioner

## 2023-10-18 VITALS — BP 124/80 | HR 65 | Temp 98.1°F | Ht 69.0 in | Wt 238.0 lb

## 2023-10-18 DIAGNOSIS — E782 Mixed hyperlipidemia: Secondary | ICD-10-CM

## 2023-10-18 DIAGNOSIS — Z0000 Encounter for general adult medical examination without abnormal findings: Secondary | ICD-10-CM | POA: Diagnosis not present

## 2023-10-18 DIAGNOSIS — Z1211 Encounter for screening for malignant neoplasm of colon: Secondary | ICD-10-CM | POA: Diagnosis not present

## 2023-10-18 DIAGNOSIS — Z125 Encounter for screening for malignant neoplasm of prostate: Secondary | ICD-10-CM

## 2023-10-18 DIAGNOSIS — N528 Other male erectile dysfunction: Secondary | ICD-10-CM | POA: Diagnosis not present

## 2023-10-18 DIAGNOSIS — R21 Rash and other nonspecific skin eruption: Secondary | ICD-10-CM | POA: Insufficient documentation

## 2023-10-18 DIAGNOSIS — E6609 Other obesity due to excess calories: Secondary | ICD-10-CM

## 2023-10-18 DIAGNOSIS — E559 Vitamin D deficiency, unspecified: Secondary | ICD-10-CM | POA: Insufficient documentation

## 2023-10-18 DIAGNOSIS — I10 Essential (primary) hypertension: Secondary | ICD-10-CM

## 2023-10-18 DIAGNOSIS — Z79899 Other long term (current) drug therapy: Secondary | ICD-10-CM

## 2023-10-18 DIAGNOSIS — R7303 Prediabetes: Secondary | ICD-10-CM | POA: Diagnosis not present

## 2023-10-18 DIAGNOSIS — Z6835 Body mass index (BMI) 35.0-35.9, adult: Secondary | ICD-10-CM

## 2023-10-18 DIAGNOSIS — E66812 Obesity, class 2: Secondary | ICD-10-CM

## 2023-10-18 LAB — POCT URINALYSIS DIP (CLINITEK)
Bilirubin, UA: NEGATIVE
Blood, UA: NEGATIVE
Glucose, UA: NEGATIVE mg/dL
Ketones, POC UA: NEGATIVE mg/dL
Leukocytes, UA: NEGATIVE
Nitrite, UA: NEGATIVE
POC PROTEIN,UA: NEGATIVE
Spec Grav, UA: 1.015 (ref 1.010–1.025)
Urobilinogen, UA: 0.2 U/dL
pH, UA: 5.5 (ref 5.0–8.0)

## 2023-10-18 LAB — POC HEMOCCULT BLD/STL (OFFICE/1-CARD/DIAGNOSTIC): Fecal Occult Blood, POC: NEGATIVE

## 2023-10-18 MED ORDER — SILDENAFIL CITRATE 50 MG PO TABS: 50.0000 mg | ORAL_TABLET | ORAL | 5 refills | Status: AC | PRN

## 2023-10-18 MED ORDER — NAPROXEN 500 MG PO TABS: 500.0000 mg | ORAL_TABLET | Freq: Two times a day (BID) | ORAL | 2 refills | Status: DC | PRN

## 2023-10-18 NOTE — Assessment & Plan Note (Signed)
 Blood pressure is well controlled, continue current medications.

## 2023-10-18 NOTE — Patient Instructions (Signed)
 Health Maintenance, Male  Adopting a healthy lifestyle and getting preventive care are important in promoting health and wellness. Ask your health care provider about:  The right schedule for you to have regular tests and exams.  Things you can do on your own to prevent diseases and keep yourself healthy.  What should I know about diet, weight, and exercise?  Eat a healthy diet    Eat a diet that includes plenty of vegetables, fruits, low-fat dairy products, and lean protein.  Do not eat a lot of foods that are high in solid fats, added sugars, or sodium.  Maintain a healthy weight  Body mass index (BMI) is a measurement that can be used to identify possible weight problems. It estimates body fat based on height and weight. Your health care provider can help determine your BMI and help you achieve or maintain a healthy weight.  Get regular exercise  Get regular exercise. This is one of the most important things you can do for your health. Most adults should:  Exercise for at least 150 minutes each week. The exercise should increase your heart rate and make you sweat (moderate-intensity exercise).  Do strengthening exercises at least twice a week. This is in addition to the moderate-intensity exercise.  Spend less time sitting. Even light physical activity can be beneficial.  Watch cholesterol and blood lipids  Have your blood tested for lipids and cholesterol at 60 years of age, then have this test every 5 years.  You may need to have your cholesterol levels checked more often if:  Your lipid or cholesterol levels are high.  You are older than 61 years of age.  You are at high risk for heart disease.  What should I know about cancer screening?  Many types of cancers can be detected early and may often be prevented. Depending on your health history and family history, you may need to have cancer screening at various ages. This may include screening for:  Colorectal cancer.  Prostate cancer.  Skin cancer.  Lung  cancer.  What should I know about heart disease, diabetes, and high blood pressure?  Blood pressure and heart disease  High blood pressure causes heart disease and increases the risk of stroke. This is more likely to develop in people who have high blood pressure readings or are overweight.  Talk with your health care provider about your target blood pressure readings.  Have your blood pressure checked:  Every 3-5 years if you are 9-95 years of age.  Every year if you are 85 years old or older.  If you are between the ages of 29 and 29 and are a current or former smoker, ask your health care provider if you should have a one-time screening for abdominal aortic aneurysm (AAA).  Diabetes  Have regular diabetes screenings. This checks your fasting blood sugar level. Have the screening done:  Once every three years after age 23 if you are at a normal weight and have a low risk for diabetes.  More often and at a younger age if you are overweight or have a high risk for diabetes.  What should I know about preventing infection?  Hepatitis B  If you have a higher risk for hepatitis B, you should be screened for this virus. Talk with your health care provider to find out if you are at risk for hepatitis B infection.  Hepatitis C  Blood testing is recommended for:  Everyone born from [birth year] through 1967.  Anyone  with known risk factors for hepatitis C.  Sexually transmitted infections (STIs)  You should be screened each year for STIs, including gonorrhea and chlamydia, if:  You are sexually active and are younger than 60 years of age.  You are older than 60 years of age and your health care provider tells you that you are at risk for this type of infection.  Your sexual activity has changed since you were last screened, and you are at increased risk for chlamydia or gonorrhea. Ask your health care provider if you are at risk.  Ask your health care provider about whether you are at high risk for HIV. Your health care provider  may recommend a prescription medicine to help prevent HIV infection. If you choose to take medicine to prevent HIV, you should first get tested for HIV. You should then be tested every 3 months for as long as you are taking the medicine.  Follow these instructions at home:  Alcohol use  Do not drink alcohol if your health care provider tells you not to drink.  If you drink alcohol:  Limit how much you have to 0-2 drinks a day.  Know how much alcohol is in your drink. In the [place] one drink equals one 12 oz bottle of beer (355 mL), one 5 oz glass of wine (148 mL), or one 1 oz glass of hard liquor (44 mL).  Lifestyle  Do not use any products that contain nicotine or tobacco. These products include cigarettes, chewing tobacco, and vaping devices, such as e-cigarettes. If you need help quitting, ask your health care provider.  Do not use street drugs.  Do not share needles.  Ask your health care provider for help if you need support or information about quitting drugs.  General instructions  Schedule regular health, dental, and eye exams.  Stay current with your vaccines.  Tell your health care provider if:  You often feel depressed.  You have ever been abused or do not feel safe at home.  Summary  Adopting a healthy lifestyle and getting preventive care are important in promoting health and wellness.  Follow your health care provider's instructions about healthy diet, exercising, and getting tested or screened for diseases.  Follow your health care provider's instructions on monitoring your cholesterol and blood pressure.  This information is not intended to replace advice given to you by your health care provider. Make sure you discuss any questions you have with your health care provider.  Document Revised: 07/28/2020 Document Reviewed: 07/28/2020  Elsevier Patient Education  2024 [employer].

## 2023-10-18 NOTE — Assessment & Plan Note (Signed)
 Rash with itching to left neck, treated with Benadryl  and eczema lotion without relief. Topical steroid may be beneficial. - Prescribe hydrocortisone cream twice daily. - Reassess if no improvement, consider alternative steroid cream.
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
## 2023-10-18 NOTE — Assessment & Plan Note (Signed)
Cholesterol levels are stable, will check lipid panel

## 2023-10-18 NOTE — Assessment & Plan Note (Signed)
 He is encouraged to strive for BMI less than 30 to decrease cardiac risk. Advised to aim for at least 150 minutes of exercise per week as tolerated

## 2023-10-18 NOTE — Assessment & Plan Note (Signed)
 Will check vitamin D level and supplement as needed.    Also encouraged to spend 15 minutes in the sun daily.

## 2023-10-18 NOTE — Assessment & Plan Note (Signed)
HgbA1c is stable, continue focusing on healthy diet and regular exercise

## 2023-10-18 NOTE — Assessment & Plan Note (Signed)
 Routine wellness visit. Regular exercise, healthy diet, weight loss noted. Reports leg swelling after prolonged sitting. Family history of prostate cancer. - Continue exercise and diet. - Encourage hydration during travel. - Check PSA level. - Provide list of dentists. - Prescribe naproxen  for back pain. - Refill sildenafil . - Refill fluticasone  nasal spray. - Obtain urine sample.

## 2023-10-19 LAB — LIPID PANEL
Chol/HDL Ratio: 3.2 ratio (ref 0.0–5.0)
Cholesterol, Total: 167 mg/dL (ref 100–199)
HDL: 52 mg/dL (ref >39)
LDL Chol Calc (NIH): 95 mg/dL (ref 0–99)
Triglycerides: 114 mg/dL (ref 0–149)
VLDL Cholesterol Cal: 20 mg/dL (ref 5–40)

## 2023-10-19 LAB — CMP14+EGFR
ALT: 20 IU/L (ref 0–44)
AST: 16 IU/L (ref 0–40)
Albumin: 4.1 g/dL (ref 3.8–4.9)
Alkaline Phosphatase: 60 IU/L (ref 44–121)
BUN/Creatinine Ratio: 11 (ref 10–24)
BUN: 12 mg/dL (ref 8–27)
Bilirubin Total: 0.4 mg/dL (ref 0.0–1.2)
CO2: 21 mmol/L (ref 20–29)
Calcium: 9.4 mg/dL (ref 8.6–10.2)
Chloride: 104 mmol/L (ref 96–106)
Creatinine, Ser: 1.13 mg/dL (ref 0.76–1.27)
Globulin, Total: 2.7 g/dL (ref 1.5–4.5)
Glucose: 106 mg/dL — ABNORMAL HIGH (ref 70–99)
Potassium: 4.5 mmol/L (ref 3.5–5.2)
Sodium: 138 mmol/L (ref 134–144)
Total Protein: 6.8 g/dL (ref 6.0–8.5)
eGFR: 74 mL/min/1.73 (ref >59)

## 2023-10-19 LAB — CBC
Hematocrit: 43.9 % (ref 37.5–51.0)
Hemoglobin: 14.1 g/dL (ref 13.0–17.7)
MCH: 29.3 pg (ref 26.6–33.0)
MCHC: 32.1 g/dL (ref 31.5–35.7)
MCV: 91 fL (ref 79–97)
Platelets: 230 x10E3/uL (ref 150–450)
RBC: 4.81 x10E6/uL (ref 4.14–5.80)
RDW: 14 % (ref 11.6–15.4)
WBC: 5 x10E3/uL (ref 3.4–10.8)

## 2023-10-19 LAB — HEMOGLOBIN A1C
Est. average glucose Bld gHb Est-mCnc: 131 mg/dL
Hgb A1c MFr Bld: 6.2 % — ABNORMAL HIGH (ref 4.8–5.6)

## 2023-10-19 LAB — PSA: Prostate Specific Ag, Serum: 1.6 ng/mL (ref 0.0–4.0)

## 2023-10-19 LAB — MICROALBUMIN / CREATININE URINE RATIO
Creatinine, Urine: 123.1 mg/dL
Microalb/Creat Ratio: <2 mg/g{creat} (ref 0–29)
Microalbumin, Urine: <3 ug/mL

## 2023-10-19 LAB — VITAMIN D 25 HYDROXY (VIT D DEFICIENCY, FRACTURES): Vit D, 25-Hydroxy: 21.2 ng/mL — ABNORMAL LOW (ref 30.0–100.0)

## 2023-10-19 LAB — TSH: TSH: 2.55 u[IU]/mL (ref 0.450–4.500)

## 2024-01-02 ENCOUNTER — Other Ambulatory Visit: Payer: Self-pay | Admitting: Nurse Practitioner

## 2024-01-02 NOTE — Telephone Encounter (Unsigned)
 Copied from CRM [id number]. Topic: Clinical - Medication Refill >> Jan 02, 2024 10:45 AM Carlatta H wrote: Medication: fluticasone  (FLONASE ) 50 MCG/ACT nasal spray  Has the patient contacted their pharmacy? No (Agent: If no, request that the patient contact the pharmacy for the refill. If patient does not wish to contact the pharmacy document the reason why and proceed with request.) (Agent: If yes, when and what did the pharmacy advise?)  This is the patient's preferred pharmacy:  [phone] [address], [place] - [address]  [place] [address] Phone: [phone] Fax: [phone]     Is this the correct pharmacy for this prescription? Yes If no, delete pharmacy and type the correct one.   Has the prescription been filled recently? No  Is the patient out of the medication? Yes  Has the patient been seen for an appointment in the last year OR does the patient have an upcoming appointment? Yes  Can we respond through MyChart? Yes  Agent: Please be advised that Rx refills may take up to 3 business days. We ask that you follow-up with your pharmacy.

## 2024-01-04 ENCOUNTER — Encounter: Payer: Self-pay | Admitting: Nurse Practitioner

## 2024-01-04 ENCOUNTER — Ambulatory Visit: Admitting: Nurse Practitioner

## 2024-01-04 VITALS — BP 130/80 | HR 83 | Temp 98.4°F | Ht 69.0 in | Wt 245.0 lb

## 2024-01-04 DIAGNOSIS — R0981 Nasal congestion: Secondary | ICD-10-CM | POA: Diagnosis not present

## 2024-01-04 DIAGNOSIS — J349 Unspecified disorder of nose and nasal sinuses: Secondary | ICD-10-CM

## 2024-01-04 DIAGNOSIS — Z8042 Family history of malignant neoplasm of prostate: Secondary | ICD-10-CM

## 2024-01-04 DIAGNOSIS — R1030 Lower abdominal pain, unspecified: Secondary | ICD-10-CM

## 2024-01-04 DIAGNOSIS — R351 Nocturia: Secondary | ICD-10-CM | POA: Diagnosis not present

## 2024-01-04 LAB — POCT URINALYSIS DIPSTICK
Bilirubin, UA: NEGATIVE
Blood, UA: NEGATIVE
Glucose, UA: NEGATIVE
Ketones, UA: NEGATIVE
Leukocytes, UA: NEGATIVE
Nitrite, UA: NEGATIVE
Protein, UA: POSITIVE — AB
Spec Grav, UA: >=1.03 — AB (ref 1.010–1.025)
Urobilinogen, UA: 0.2 U/dL
pH, UA: 5.5 (ref 5.0–8.0)

## 2024-01-04 MED ORDER — FLUTICASONE PROPIONATE 50 MCG/ACT NA SUSP: 2.0000 | Freq: Every day | NASAL | 2 refills | Status: DC

## 2024-01-04 MED ORDER — CIPROFLOXACIN HCL 500 MG PO TABS: 500.0000 mg | ORAL_TABLET | Freq: Two times a day (BID) | ORAL | 0 refills | Status: AC

## 2024-01-04 NOTE — Progress Notes (Signed)
 Glenn Carrillo, CMA,acting as a neurosurgeon for Glenn Ada, FNP.,have documented all relevant documentation on the behalf of Glenn Ada, FNP,as directed by  Glenn Ada, FNP while in the presence of Glenn Ada, FNP.  Subjective:  Patient ID: Glenn Carrillo , male    DOB: 1966/02/17 , 58 y.o.   MRN: [id number]  Chief Complaint  Patient presents with   Groin Pain    Patient presents today for pain in groin area that started about a week and a half ago. Patient denies doing anything to trigger the pain. Patient reports he feels pain with walking and driving pain is a 2/89.   Sinus Problem    Patient reports he had a Cat scan and it showed a cyst in his sinuses and he is now having increased problems with his sinuses.    Discussed the use of AI scribe software for clinical note transcription with the patient, who gave verbal consent to proceed.  History of Present Illness Glenn Carrillo is a 58 year old male who presents with abdominal and groin pain, and concerns about a possible prostate infection.  He has been experiencing pain in the abdominal, groin, and perineal regions, particularly when sitting for extended periods, for approximately two weeks. The pain is reminiscent of previous episodes of prostate infection treated at this clinic. No lumps or masses in the groin area, changes in urine stream flow, hesitation to urinate, or dysuria. However, he experiences nocturia, urinating four times a night, which is consistent with his usual pattern and not a recent change.  He has a history of prostate infection treated at this clinic, though specific treatments are not detailed. He has not consulted a urologist since 2023, and the current pain differs from the scrotal pain experienced at that time.  Regarding sinus issues, he recalls being told that a previous CT or MRI scan showed a cyst in his sinuses, but the most recent MRI only noted moderate paranasal sinus disease (sinusitis). He uses a  generic brand of Flonase  nasal spray every morning, but his prescription ran out last week.  He is a driver and wears regular briefs, noting that the pain in his groin and perineal area occurs when sitting for extended periods, possibly related to his occupation.   Pain has been ongoing for the last 2 weeks. He reports having  prostate infection in the past. Wakes up at night at least 4 times.      Past Medical History:  Diagnosis Date   Allergies    Allergy    Elevated cholesterol    Headache    Hypertension      Family History  Problem Relation Age of Onset   Colon polyps Mother    Dementia Mother    Prostate cancer Father    Diabetes Paternal Grandfather    Colon cancer Neg Hx    Esophageal cancer Neg Hx    Pancreatic cancer Neg Hx    Stomach cancer Neg Hx      Current Outpatient Medications:    albuterol  (VENTOLIN  HFA) 108 (90 Base) MCG/ACT inhaler, Inhale 2 puffs into the lungs every 6 (six) hours as needed for wheezing or shortness of breath., Disp: 8 g, Rfl: 2   amLODipine  (NORVASC ) 10 MG tablet, TAKE 1 TABLET BY MOUTH EVERY DAY, Disp: 90 tablet, Rfl: 1   atorvastatin  (LIPITOR) 10 MG tablet, TAKE 1 TABLET BY MOUTH EVERY DAY, Disp: 90 tablet, Rfl: 1   cetirizine  (ZYRTEC ) 10 MG tablet, Take 1 tablet (10 mg  total) by mouth daily as needed for allergies., Disp: 90 tablet, Rfl: 1   ciprofloxacin (CIPRO) 500 MG tablet, Take 1 tablet (500 mg total) by mouth 2 (two) times daily., Disp: 20 tablet, Rfl: 0   naproxen  (NAPROSYN ) 500 MG tablet, Take 1 tablet (500 mg total) by mouth 2 (two) times daily as needed., Disp: 30 tablet, Rfl: 2   promethazine -dextromethorphan (PROMETHAZINE -DM) 6.25-15 MG/5ML syrup, Take 5 mLs by mouth every 8 (eight) hours as needed for cough., Disp: 180 mL, Rfl: 0   sildenafil  (VIAGRA ) 50 MG tablet, Take 1 tablet (50 mg total) by mouth as needed for erectile dysfunction., Disp: 20 tablet, Rfl: 5   Vitamin D , Ergocalciferol , (DRISDOL ) 1.25 MG (50000 UNIT)  CAPS capsule, Take 1 capsule (50,000 Units total) by mouth every 7 (seven) days., Disp: 12 capsule, Rfl: 1   fluticasone  (FLONASE ) 50 MCG/ACT nasal spray, Place 2 sprays into both nostrils daily., Disp: 9.9 mL, Rfl: 2   Allergies  Allergen Reactions   Chlorhexidine  Itching   Prednisone      ACHY JOINTS     Review of Systems  Constitutional: Negative.   Respiratory: Negative.    Cardiovascular: Negative.      Today's Vitals   01/04/24 0843  BP: 130/80  Pulse: 83  Temp: 98.4 F (36.9 C)  TempSrc: Oral  Weight: 245 lb (111.1 kg)  Height: 5' 9 (1.753 m)  PainSc: 0-No pain   Body mass index is 36.18 kg/m.  Wt Readings from Last 3 Encounters:  01/04/24 245 lb (111.1 kg)  10/18/23 238 lb (108 kg)  06/22/23 243 lb 6.4 oz (110.4 kg)    The 10-year ASCVD risk score (Arnett DK, et al., 2019) is: 13%   Values used to calculate the score:     Age: 30 years     Clincally relevant sex: Male     Is Non-Hispanic African American: Yes     Diabetic: No     Tobacco smoker: No     Systolic Blood Pressure: 130 mmHg     Is BP treated: Yes     HDL Cholesterol: 52 mg/dL     Total Cholesterol: 167 mg/dL  Objective:  Physical Exam Vitals and nursing note reviewed.  Constitutional:      General: He is not in acute distress.    Appearance: Normal appearance. He is obese.  HENT:     Head: Normocephalic.     Nose:     Right Turbinates: Swollen.     Left Turbinates: Swollen.     Mouth/Throat:     Comments: Deferred - masked Cardiovascular:     Rate and Rhythm: Normal rate and regular rhythm.     Pulses: Normal pulses.     Heart sounds: Normal heart sounds. No murmur heard. Pulmonary:     Effort: Pulmonary effort is normal. No respiratory distress.     Breath sounds: Normal breath sounds. No wheezing.  Musculoskeletal:     Cervical back: Normal range of motion and neck supple.  Skin:    General: Skin is warm and dry.     Capillary Refill: Capillary refill takes less than 2  seconds.  Neurological:     General: No focal deficit present.     Mental Status: He is alert and oriented to person, place, and time.     Cranial Nerves: No cranial nerve deficit.     Motor: No weakness.  Psychiatric:        Mood and Affect: Mood normal.  Behavior: Behavior normal.        Thought Content: Thought content normal.        Judgment: Judgment normal.      Assessment And Plan:  Inguinal pain, unspecified laterality Assessment & Plan: Differential diagnosis includes prostatitis and benign prostatic hyperplasia. Previous prostate infection noted. - Obtain urine sample for infection. - Order PSA test. - Consider CT abdomen and pelvis if symptoms persist. - Prescribe Ciprofloxacin 500 mg orally twice daily for 10 days. Advise to avoid running or jumping. - Administer steroid injection for joint pain.  Orders: -     CT ABDOMEN PELVIS WO CONTRAST; Future  Paranasal sinus disease Assessment & Plan: Chronic sinusitis noted on MRI. Symptoms include sinus congestion. - Prescribe Ciprofloxacin 500 mg orally twice daily for 10 days. - Administer steroid injection. - Continue Fluticasone  nasal spray daily. - Refer to ENT for further evaluation.  Orders: -     Ambulatory referral to ENT  Nasal congestion -     Ambulatory referral to ENT  Nocturia Assessment & Plan: Will check PSA due to previous history of prostatitis per patient  Orders: -     PSA -     POCT urinalysis dipstick  Family history of prostate cancer in father -     PSA  Other orders -     Ciprofloxacin HCl; Take 1 tablet (500 mg total) by mouth 2 (two) times daily.  Dispense: 20 tablet; Refill: 0    Return in about 3 months (around 04/05/2024) for bp check.  Patient was given opportunity to ask questions. Patient verbalized understanding of the plan and was able to repeat key elements of the plan. All questions were answered to their satisfaction.    Glenn Glenn Ada, FNP, have reviewed all  documentation for this visit. The documentation on 01/04/24 for the exam, diagnosis, procedures, and orders are all accurate and complete.   IF YOU HAVE BEEN REFERRED TO A SPECIALIST, IT MAY TAKE 1-2 WEEKS TO SCHEDULE/PROCESS THE REFERRAL. IF YOU HAVE NOT HEARD FROM US /SPECIALIST IN TWO WEEKS, PLEASE GIVE US  A CALL AT [phone].

## 2024-01-05 LAB — PSA: Prostate Specific Ag, Serum: 1.8 ng/mL (ref 0.0–4.0)

## 2024-01-12 ENCOUNTER — Inpatient Hospital Stay
Admission: RE | Admit: 2024-01-12 | Discharge: 2024-01-12 | Attending: Nurse Practitioner | Admitting: Nurse Practitioner

## 2024-01-12 DIAGNOSIS — R1030 Lower abdominal pain, unspecified: Secondary | ICD-10-CM

## 2024-01-15 ENCOUNTER — Ambulatory Visit: Payer: Self-pay | Admitting: Nurse Practitioner

## 2024-01-15 DIAGNOSIS — R1030 Lower abdominal pain, unspecified: Secondary | ICD-10-CM | POA: Insufficient documentation

## 2024-01-15 DIAGNOSIS — R351 Nocturia: Secondary | ICD-10-CM | POA: Insufficient documentation

## 2024-01-15 DIAGNOSIS — J349 Unspecified disorder of nose and nasal sinuses: Secondary | ICD-10-CM | POA: Insufficient documentation

## 2024-01-15 DIAGNOSIS — R0981 Nasal congestion: Secondary | ICD-10-CM | POA: Insufficient documentation

## 2024-01-15 NOTE — Assessment & Plan Note (Signed)
 Chronic sinusitis noted on MRI. Symptoms include sinus congestion. - Prescribe Ciprofloxacin 500 mg orally twice daily for 10 days. - Administer steroid injection. - Continue Fluticasone  nasal spray daily. - Refer to ENT for further evaluation.

## 2024-01-15 NOTE — Assessment & Plan Note (Signed)
 Differential diagnosis includes prostatitis and benign prostatic hyperplasia. Previous prostate infection noted. - Obtain urine sample for infection. - Order PSA test. - Consider CT abdomen and pelvis if symptoms persist. - Prescribe Ciprofloxacin 500 mg orally twice daily for 10 days. Advise to avoid running or jumping. - Administer steroid injection for joint pain.

## 2024-01-15 NOTE — Assessment & Plan Note (Signed)
 Will check PSA due to previous history of prostatitis per patient

## 2024-01-18 ENCOUNTER — Other Ambulatory Visit: Payer: Self-pay | Admitting: Nurse Practitioner

## 2024-01-18 DIAGNOSIS — E782 Mixed hyperlipidemia: Secondary | ICD-10-CM

## 2024-01-28 ENCOUNTER — Other Ambulatory Visit: Payer: Self-pay | Admitting: Nurse Practitioner

## 2024-02-14 ENCOUNTER — Ambulatory Visit (INDEPENDENT_AMBULATORY_CARE_PROVIDER_SITE_OTHER): Admitting: Otolaryngology

## 2024-02-14 ENCOUNTER — Encounter (INDEPENDENT_AMBULATORY_CARE_PROVIDER_SITE_OTHER): Payer: Self-pay | Admitting: Otolaryngology

## 2024-02-14 VITALS — BP 136/82 | HR 80 | Temp 97.7°F | Ht 68.8 in | Wt 232.0 lb

## 2024-02-14 DIAGNOSIS — J342 Deviated nasal septum: Secondary | ICD-10-CM

## 2024-02-14 DIAGNOSIS — J324 Chronic pansinusitis: Secondary | ICD-10-CM | POA: Insufficient documentation

## 2024-02-14 DIAGNOSIS — J309 Allergic rhinitis, unspecified: Secondary | ICD-10-CM | POA: Diagnosis not present

## 2024-02-14 DIAGNOSIS — J343 Hypertrophy of nasal turbinates: Secondary | ICD-10-CM | POA: Diagnosis not present
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
## 2024-02-15 DIAGNOSIS — J343 Hypertrophy of nasal turbinates: Secondary | ICD-10-CM | POA: Insufficient documentation

## 2024-02-15 DIAGNOSIS — J342 Deviated nasal septum: Secondary | ICD-10-CM | POA: Insufficient documentation

## 2024-02-20 ENCOUNTER — Telehealth: Payer: Self-pay

## 2024-02-20 NOTE — Telephone Encounter (Signed)
 Patient dropped off FMLA for wife, patients wife hasn't been seen since February. Patients wife needs appointment patient notified. Patients wife stated she would just pick up paper work and take it to another provider. Paper placed up front for patient to pick up, front desk aware to process refund.

## 2024-03-21 ENCOUNTER — Other Ambulatory Visit: Payer: Self-pay | Admitting: Nurse Practitioner

## 2024-03-28 ENCOUNTER — Telehealth (INDEPENDENT_AMBULATORY_CARE_PROVIDER_SITE_OTHER): Payer: Self-pay | Admitting: Otolaryngology

## 2024-03-28 ENCOUNTER — Encounter (INDEPENDENT_AMBULATORY_CARE_PROVIDER_SITE_OTHER): Payer: Self-pay

## 2024-03-28 NOTE — Telephone Encounter (Signed)
 Sent MyChart message and left voice message for patient to call back.  03/29/2024 appointment needs to be cancelled / rescheduled.  Imaging studies not done.

## 2024-03-29 ENCOUNTER — Ambulatory Visit (INDEPENDENT_AMBULATORY_CARE_PROVIDER_SITE_OTHER): Admitting: Otolaryngology

## 2024-04-12 ENCOUNTER — Ambulatory Visit: Payer: Self-pay | Admitting: Nurse Practitioner

## 2024-05-02 ENCOUNTER — Ambulatory Visit: Payer: Self-pay | Admitting: Nurse Practitioner

## 2024-10-23 ENCOUNTER — Encounter: Payer: Self-pay | Admitting: Nurse Practitioner
